# Patient Record
Sex: Male | Born: 1970 | Race: Black or African American | Hispanic: No | Marital: Married
Health system: Midwestern US, Community
[De-identification: ages and names within clinical notes are randomized; demographics above are authoritative.]

## PROBLEM LIST (undated history)

## (undated) DIAGNOSIS — Z1389 Encounter for screening for other disorder: Secondary | ICD-10-CM

## (undated) DIAGNOSIS — M5416 Radiculopathy, lumbar region: Secondary | ICD-10-CM

## (undated) DIAGNOSIS — Z0189 Encounter for other specified special examinations: Secondary | ICD-10-CM

## (undated) DIAGNOSIS — M79605 Pain in left leg: Secondary | ICD-10-CM

## (undated) DIAGNOSIS — R569 Unspecified convulsions: Secondary | ICD-10-CM

## (undated) DIAGNOSIS — Z889 Allergy status to unspecified drugs, medicaments and biological substances status: Secondary | ICD-10-CM

## (undated) DIAGNOSIS — Z87442 Personal history of urinary calculi: Secondary | ICD-10-CM

## (undated) DIAGNOSIS — F4310 Post-traumatic stress disorder, unspecified: Secondary | ICD-10-CM

## (undated) DIAGNOSIS — G4730 Sleep apnea, unspecified: Secondary | ICD-10-CM

## (undated) HISTORY — PX: OTHER SURGICAL HISTORY: SHX169

---

## 2015-05-14 ENCOUNTER — Other Ambulatory Visit: Payer: Self-pay | Admitting: Allergy and Immunology

## 2015-05-14 DIAGNOSIS — J3089 Other allergic rhinitis: Secondary | ICD-10-CM

## 2015-05-15 DIAGNOSIS — J301 Allergic rhinitis due to pollen: Secondary | ICD-10-CM | POA: Diagnosis not present

## 2015-05-16 DIAGNOSIS — J3089 Other allergic rhinitis: Secondary | ICD-10-CM | POA: Diagnosis not present

## 2015-05-19 ENCOUNTER — Ambulatory Visit (INDEPENDENT_AMBULATORY_CARE_PROVIDER_SITE_OTHER): Payer: Federal, State, Local not specified - PPO

## 2015-05-19 DIAGNOSIS — J309 Allergic rhinitis, unspecified: Secondary | ICD-10-CM | POA: Diagnosis not present

## 2015-05-19 NOTE — Progress Notes (Signed)
Immunotherapy   Patient Details  Name: Walter SavinDerrick Ortiz MRN: [id number] Date of Birth: 1970/03/13  05/19/2015  Nancy NordmannDerrick Ortiz---Patient came in today to start ITX. Following schedule: A Frequency:  1-2 times weekly Epi-Pen:  Patient has an EpiPen and understands its use.   Consent signed and patient instructions given. Allergy Guidelines reviewed with patient.   Emergency Action Plan in place and discussed with patient.    Nida Boatmanatricia Lucky Alverson 05/19/2015, 9:46 AM

## 2015-05-22 ENCOUNTER — Ambulatory Visit (INDEPENDENT_AMBULATORY_CARE_PROVIDER_SITE_OTHER): Payer: Federal, State, Local not specified - PPO

## 2015-05-22 DIAGNOSIS — J309 Allergic rhinitis, unspecified: Secondary | ICD-10-CM | POA: Diagnosis not present

## 2015-05-26 ENCOUNTER — Ambulatory Visit (INDEPENDENT_AMBULATORY_CARE_PROVIDER_SITE_OTHER): Payer: Federal, State, Local not specified - PPO

## 2015-05-26 DIAGNOSIS — J309 Allergic rhinitis, unspecified: Secondary | ICD-10-CM | POA: Diagnosis not present

## 2015-05-29 ENCOUNTER — Ambulatory Visit (INDEPENDENT_AMBULATORY_CARE_PROVIDER_SITE_OTHER): Payer: Federal, State, Local not specified - PPO

## 2015-05-29 DIAGNOSIS — J309 Allergic rhinitis, unspecified: Secondary | ICD-10-CM

## 2015-06-02 ENCOUNTER — Ambulatory Visit (INDEPENDENT_AMBULATORY_CARE_PROVIDER_SITE_OTHER): Payer: Federal, State, Local not specified - PPO

## 2015-06-02 DIAGNOSIS — J309 Allergic rhinitis, unspecified: Secondary | ICD-10-CM

## 2015-06-05 ENCOUNTER — Ambulatory Visit (INDEPENDENT_AMBULATORY_CARE_PROVIDER_SITE_OTHER): Payer: Federal, State, Local not specified - PPO

## 2015-06-05 DIAGNOSIS — J309 Allergic rhinitis, unspecified: Secondary | ICD-10-CM

## 2015-06-09 ENCOUNTER — Ambulatory Visit (INDEPENDENT_AMBULATORY_CARE_PROVIDER_SITE_OTHER): Payer: Federal, State, Local not specified - PPO

## 2015-06-09 DIAGNOSIS — J309 Allergic rhinitis, unspecified: Secondary | ICD-10-CM

## 2015-06-16 ENCOUNTER — Ambulatory Visit (INDEPENDENT_AMBULATORY_CARE_PROVIDER_SITE_OTHER): Payer: Federal, State, Local not specified - PPO

## 2015-06-16 DIAGNOSIS — J309 Allergic rhinitis, unspecified: Secondary | ICD-10-CM

## 2015-06-19 ENCOUNTER — Ambulatory Visit (INDEPENDENT_AMBULATORY_CARE_PROVIDER_SITE_OTHER): Payer: Federal, State, Local not specified - PPO

## 2015-06-19 DIAGNOSIS — J309 Allergic rhinitis, unspecified: Secondary | ICD-10-CM

## 2015-06-24 ENCOUNTER — Ambulatory Visit (INDEPENDENT_AMBULATORY_CARE_PROVIDER_SITE_OTHER): Payer: Federal, State, Local not specified - PPO

## 2015-06-24 DIAGNOSIS — J309 Allergic rhinitis, unspecified: Secondary | ICD-10-CM

## 2015-06-26 ENCOUNTER — Ambulatory Visit (INDEPENDENT_AMBULATORY_CARE_PROVIDER_SITE_OTHER): Payer: Federal, State, Local not specified - PPO

## 2015-06-26 DIAGNOSIS — J309 Allergic rhinitis, unspecified: Secondary | ICD-10-CM | POA: Diagnosis not present

## 2015-07-03 ENCOUNTER — Ambulatory Visit (INDEPENDENT_AMBULATORY_CARE_PROVIDER_SITE_OTHER): Payer: Federal, State, Local not specified - PPO

## 2015-07-03 DIAGNOSIS — J309 Allergic rhinitis, unspecified: Secondary | ICD-10-CM | POA: Diagnosis not present

## 2015-07-07 ENCOUNTER — Ambulatory Visit (INDEPENDENT_AMBULATORY_CARE_PROVIDER_SITE_OTHER): Payer: Federal, State, Local not specified - PPO | Admitting: *Deleted

## 2015-07-07 DIAGNOSIS — J309 Allergic rhinitis, unspecified: Secondary | ICD-10-CM | POA: Diagnosis not present

## 2015-07-10 ENCOUNTER — Ambulatory Visit (INDEPENDENT_AMBULATORY_CARE_PROVIDER_SITE_OTHER): Payer: Federal, State, Local not specified - PPO

## 2015-07-10 DIAGNOSIS — J309 Allergic rhinitis, unspecified: Secondary | ICD-10-CM

## 2015-07-14 ENCOUNTER — Ambulatory Visit (INDEPENDENT_AMBULATORY_CARE_PROVIDER_SITE_OTHER): Payer: Federal, State, Local not specified - PPO

## 2015-07-14 DIAGNOSIS — J309 Allergic rhinitis, unspecified: Secondary | ICD-10-CM | POA: Diagnosis not present

## 2015-07-17 ENCOUNTER — Ambulatory Visit (INDEPENDENT_AMBULATORY_CARE_PROVIDER_SITE_OTHER): Payer: Federal, State, Local not specified - PPO

## 2015-07-17 DIAGNOSIS — J309 Allergic rhinitis, unspecified: Secondary | ICD-10-CM | POA: Diagnosis not present

## 2015-07-24 ENCOUNTER — Ambulatory Visit (INDEPENDENT_AMBULATORY_CARE_PROVIDER_SITE_OTHER): Payer: Federal, State, Local not specified - PPO

## 2015-07-24 DIAGNOSIS — J309 Allergic rhinitis, unspecified: Secondary | ICD-10-CM

## 2015-07-28 ENCOUNTER — Ambulatory Visit (INDEPENDENT_AMBULATORY_CARE_PROVIDER_SITE_OTHER): Payer: Federal, State, Local not specified - PPO

## 2015-07-28 DIAGNOSIS — J309 Allergic rhinitis, unspecified: Secondary | ICD-10-CM | POA: Diagnosis not present

## 2015-07-31 ENCOUNTER — Ambulatory Visit (INDEPENDENT_AMBULATORY_CARE_PROVIDER_SITE_OTHER): Payer: Federal, State, Local not specified - PPO

## 2015-07-31 DIAGNOSIS — J309 Allergic rhinitis, unspecified: Secondary | ICD-10-CM

## 2015-08-04 ENCOUNTER — Ambulatory Visit (INDEPENDENT_AMBULATORY_CARE_PROVIDER_SITE_OTHER): Payer: Federal, State, Local not specified - PPO

## 2015-08-04 DIAGNOSIS — J309 Allergic rhinitis, unspecified: Secondary | ICD-10-CM

## 2015-08-07 ENCOUNTER — Ambulatory Visit (INDEPENDENT_AMBULATORY_CARE_PROVIDER_SITE_OTHER): Payer: Federal, State, Local not specified - PPO

## 2015-08-07 DIAGNOSIS — J309 Allergic rhinitis, unspecified: Secondary | ICD-10-CM

## 2015-08-14 ENCOUNTER — Ambulatory Visit (INDEPENDENT_AMBULATORY_CARE_PROVIDER_SITE_OTHER): Payer: Federal, State, Local not specified - PPO

## 2015-08-14 DIAGNOSIS — J309 Allergic rhinitis, unspecified: Secondary | ICD-10-CM | POA: Diagnosis not present

## 2015-08-28 ENCOUNTER — Ambulatory Visit (INDEPENDENT_AMBULATORY_CARE_PROVIDER_SITE_OTHER): Payer: Federal, State, Local not specified - PPO

## 2015-08-28 DIAGNOSIS — J309 Allergic rhinitis, unspecified: Secondary | ICD-10-CM | POA: Diagnosis not present

## 2015-09-01 ENCOUNTER — Ambulatory Visit (INDEPENDENT_AMBULATORY_CARE_PROVIDER_SITE_OTHER): Payer: Federal, State, Local not specified - PPO | Admitting: *Deleted

## 2015-09-01 DIAGNOSIS — J309 Allergic rhinitis, unspecified: Secondary | ICD-10-CM

## 2015-09-04 ENCOUNTER — Ambulatory Visit (INDEPENDENT_AMBULATORY_CARE_PROVIDER_SITE_OTHER): Payer: Federal, State, Local not specified - PPO

## 2015-09-04 DIAGNOSIS — J309 Allergic rhinitis, unspecified: Secondary | ICD-10-CM | POA: Diagnosis not present

## 2015-09-08 ENCOUNTER — Ambulatory Visit (INDEPENDENT_AMBULATORY_CARE_PROVIDER_SITE_OTHER): Payer: Federal, State, Local not specified - PPO | Admitting: *Deleted

## 2015-09-08 DIAGNOSIS — J309 Allergic rhinitis, unspecified: Secondary | ICD-10-CM

## 2015-09-15 ENCOUNTER — Ambulatory Visit (INDEPENDENT_AMBULATORY_CARE_PROVIDER_SITE_OTHER): Payer: Federal, State, Local not specified - PPO | Admitting: *Deleted

## 2015-09-15 DIAGNOSIS — J309 Allergic rhinitis, unspecified: Secondary | ICD-10-CM | POA: Diagnosis not present

## 2015-09-18 ENCOUNTER — Ambulatory Visit (INDEPENDENT_AMBULATORY_CARE_PROVIDER_SITE_OTHER): Payer: Federal, State, Local not specified - PPO

## 2015-09-18 DIAGNOSIS — J309 Allergic rhinitis, unspecified: Secondary | ICD-10-CM | POA: Diagnosis not present

## 2015-09-22 ENCOUNTER — Ambulatory Visit (INDEPENDENT_AMBULATORY_CARE_PROVIDER_SITE_OTHER): Payer: Federal, State, Local not specified - PPO

## 2015-09-22 DIAGNOSIS — J309 Allergic rhinitis, unspecified: Secondary | ICD-10-CM | POA: Diagnosis not present

## 2015-09-25 ENCOUNTER — Telehealth: Payer: Self-pay | Admitting: Pediatrics

## 2015-09-25 ENCOUNTER — Ambulatory Visit (INDEPENDENT_AMBULATORY_CARE_PROVIDER_SITE_OTHER): Payer: Federal, State, Local not specified - PPO

## 2015-09-25 DIAGNOSIS — J309 Allergic rhinitis, unspecified: Secondary | ICD-10-CM | POA: Diagnosis not present

## 2015-09-25 NOTE — Telephone Encounter (Signed)
Pt wife called with a question on a bill. Please call back when possible. Thanks

## 2015-09-25 NOTE — Telephone Encounter (Signed)
Told her we rec'd pmt on 09-01-15

## 2015-09-29 ENCOUNTER — Ambulatory Visit (INDEPENDENT_AMBULATORY_CARE_PROVIDER_SITE_OTHER): Payer: Federal, State, Local not specified - PPO | Admitting: *Deleted

## 2015-09-29 DIAGNOSIS — J309 Allergic rhinitis, unspecified: Secondary | ICD-10-CM | POA: Diagnosis not present

## 2015-10-06 ENCOUNTER — Ambulatory Visit (INDEPENDENT_AMBULATORY_CARE_PROVIDER_SITE_OTHER): Payer: Federal, State, Local not specified - PPO

## 2015-10-06 DIAGNOSIS — J309 Allergic rhinitis, unspecified: Secondary | ICD-10-CM | POA: Diagnosis not present

## 2015-10-13 ENCOUNTER — Ambulatory Visit (INDEPENDENT_AMBULATORY_CARE_PROVIDER_SITE_OTHER): Payer: Federal, State, Local not specified - PPO

## 2015-10-13 DIAGNOSIS — J309 Allergic rhinitis, unspecified: Secondary | ICD-10-CM

## 2015-10-20 ENCOUNTER — Ambulatory Visit (INDEPENDENT_AMBULATORY_CARE_PROVIDER_SITE_OTHER): Payer: Federal, State, Local not specified - PPO

## 2015-10-20 DIAGNOSIS — J309 Allergic rhinitis, unspecified: Secondary | ICD-10-CM

## 2015-11-03 ENCOUNTER — Ambulatory Visit (INDEPENDENT_AMBULATORY_CARE_PROVIDER_SITE_OTHER): Payer: Federal, State, Local not specified - PPO | Admitting: *Deleted

## 2015-11-03 DIAGNOSIS — J309 Allergic rhinitis, unspecified: Secondary | ICD-10-CM

## 2015-11-10 ENCOUNTER — Ambulatory Visit (INDEPENDENT_AMBULATORY_CARE_PROVIDER_SITE_OTHER): Payer: Federal, State, Local not specified - PPO

## 2015-11-10 DIAGNOSIS — J309 Allergic rhinitis, unspecified: Secondary | ICD-10-CM | POA: Diagnosis not present

## 2015-11-17 ENCOUNTER — Ambulatory Visit (INDEPENDENT_AMBULATORY_CARE_PROVIDER_SITE_OTHER): Payer: Federal, State, Local not specified - PPO | Admitting: *Deleted

## 2015-11-17 DIAGNOSIS — J309 Allergic rhinitis, unspecified: Secondary | ICD-10-CM | POA: Diagnosis not present

## 2015-11-24 ENCOUNTER — Ambulatory Visit (INDEPENDENT_AMBULATORY_CARE_PROVIDER_SITE_OTHER): Payer: Federal, State, Local not specified - PPO

## 2015-11-24 DIAGNOSIS — J309 Allergic rhinitis, unspecified: Secondary | ICD-10-CM | POA: Diagnosis not present

## 2015-12-02 ENCOUNTER — Ambulatory Visit (INDEPENDENT_AMBULATORY_CARE_PROVIDER_SITE_OTHER): Payer: Federal, State, Local not specified - PPO | Admitting: *Deleted

## 2015-12-02 DIAGNOSIS — J309 Allergic rhinitis, unspecified: Secondary | ICD-10-CM

## 2015-12-08 ENCOUNTER — Ambulatory Visit (INDEPENDENT_AMBULATORY_CARE_PROVIDER_SITE_OTHER): Payer: Federal, State, Local not specified - PPO | Admitting: *Deleted

## 2015-12-08 DIAGNOSIS — J309 Allergic rhinitis, unspecified: Secondary | ICD-10-CM | POA: Diagnosis not present

## 2015-12-15 ENCOUNTER — Ambulatory Visit (INDEPENDENT_AMBULATORY_CARE_PROVIDER_SITE_OTHER): Payer: Federal, State, Local not specified - PPO | Admitting: *Deleted

## 2015-12-15 DIAGNOSIS — F4323 Adjustment disorder with mixed anxiety and depressed mood: Secondary | ICD-10-CM | POA: Diagnosis not present

## 2015-12-15 DIAGNOSIS — J309 Allergic rhinitis, unspecified: Secondary | ICD-10-CM

## 2015-12-16 DIAGNOSIS — J301 Allergic rhinitis due to pollen: Secondary | ICD-10-CM | POA: Diagnosis not present

## 2015-12-17 DIAGNOSIS — J3089 Other allergic rhinitis: Secondary | ICD-10-CM | POA: Diagnosis not present

## 2015-12-22 ENCOUNTER — Ambulatory Visit (INDEPENDENT_AMBULATORY_CARE_PROVIDER_SITE_OTHER): Payer: Federal, State, Local not specified - PPO

## 2015-12-22 DIAGNOSIS — J309 Allergic rhinitis, unspecified: Secondary | ICD-10-CM

## 2015-12-29 ENCOUNTER — Ambulatory Visit (INDEPENDENT_AMBULATORY_CARE_PROVIDER_SITE_OTHER): Payer: Federal, State, Local not specified - PPO | Admitting: *Deleted

## 2015-12-29 DIAGNOSIS — J309 Allergic rhinitis, unspecified: Secondary | ICD-10-CM

## 2015-12-29 DIAGNOSIS — F4323 Adjustment disorder with mixed anxiety and depressed mood: Secondary | ICD-10-CM | POA: Diagnosis not present

## 2016-01-05 ENCOUNTER — Ambulatory Visit (INDEPENDENT_AMBULATORY_CARE_PROVIDER_SITE_OTHER): Payer: Federal, State, Local not specified - PPO | Admitting: *Deleted

## 2016-01-05 DIAGNOSIS — J309 Allergic rhinitis, unspecified: Secondary | ICD-10-CM | POA: Diagnosis not present

## 2016-01-05 DIAGNOSIS — F4323 Adjustment disorder with mixed anxiety and depressed mood: Secondary | ICD-10-CM | POA: Diagnosis not present

## 2016-01-12 ENCOUNTER — Ambulatory Visit (INDEPENDENT_AMBULATORY_CARE_PROVIDER_SITE_OTHER): Payer: Federal, State, Local not specified - PPO | Admitting: *Deleted

## 2016-01-12 DIAGNOSIS — J309 Allergic rhinitis, unspecified: Secondary | ICD-10-CM | POA: Diagnosis not present

## 2016-01-12 DIAGNOSIS — F4323 Adjustment disorder with mixed anxiety and depressed mood: Secondary | ICD-10-CM | POA: Diagnosis not present

## 2016-01-22 ENCOUNTER — Ambulatory Visit (INDEPENDENT_AMBULATORY_CARE_PROVIDER_SITE_OTHER): Payer: Federal, State, Local not specified - PPO | Admitting: *Deleted

## 2016-01-22 DIAGNOSIS — J309 Allergic rhinitis, unspecified: Secondary | ICD-10-CM | POA: Diagnosis not present

## 2016-01-26 ENCOUNTER — Ambulatory Visit (INDEPENDENT_AMBULATORY_CARE_PROVIDER_SITE_OTHER): Payer: Federal, State, Local not specified - PPO | Admitting: *Deleted

## 2016-01-26 DIAGNOSIS — J309 Allergic rhinitis, unspecified: Secondary | ICD-10-CM | POA: Diagnosis not present

## 2016-02-02 ENCOUNTER — Ambulatory Visit (INDEPENDENT_AMBULATORY_CARE_PROVIDER_SITE_OTHER): Payer: Federal, State, Local not specified - PPO | Admitting: *Deleted

## 2016-02-02 DIAGNOSIS — J309 Allergic rhinitis, unspecified: Secondary | ICD-10-CM

## 2016-02-09 DIAGNOSIS — F4323 Adjustment disorder with mixed anxiety and depressed mood: Secondary | ICD-10-CM | POA: Diagnosis not present

## 2016-02-13 DIAGNOSIS — G4733 Obstructive sleep apnea (adult) (pediatric): Secondary | ICD-10-CM | POA: Diagnosis not present

## 2016-02-26 ENCOUNTER — Ambulatory Visit (INDEPENDENT_AMBULATORY_CARE_PROVIDER_SITE_OTHER): Payer: Federal, State, Local not specified - PPO

## 2016-02-26 DIAGNOSIS — J309 Allergic rhinitis, unspecified: Secondary | ICD-10-CM

## 2016-03-04 ENCOUNTER — Ambulatory Visit (INDEPENDENT_AMBULATORY_CARE_PROVIDER_SITE_OTHER): Payer: Federal, State, Local not specified - PPO

## 2016-03-04 DIAGNOSIS — J309 Allergic rhinitis, unspecified: Secondary | ICD-10-CM | POA: Diagnosis not present

## 2016-03-15 DIAGNOSIS — F4323 Adjustment disorder with mixed anxiety and depressed mood: Secondary | ICD-10-CM | POA: Diagnosis not present

## 2016-03-16 ENCOUNTER — Ambulatory Visit (INDEPENDENT_AMBULATORY_CARE_PROVIDER_SITE_OTHER): Payer: Federal, State, Local not specified - PPO | Admitting: *Deleted

## 2016-03-16 DIAGNOSIS — J309 Allergic rhinitis, unspecified: Secondary | ICD-10-CM

## 2016-03-25 ENCOUNTER — Ambulatory Visit (INDEPENDENT_AMBULATORY_CARE_PROVIDER_SITE_OTHER): Payer: Federal, State, Local not specified - PPO

## 2016-03-25 DIAGNOSIS — J309 Allergic rhinitis, unspecified: Secondary | ICD-10-CM | POA: Diagnosis not present

## 2016-04-01 ENCOUNTER — Ambulatory Visit (INDEPENDENT_AMBULATORY_CARE_PROVIDER_SITE_OTHER): Payer: Federal, State, Local not specified - PPO | Admitting: *Deleted

## 2016-04-01 DIAGNOSIS — J309 Allergic rhinitis, unspecified: Secondary | ICD-10-CM

## 2016-04-08 ENCOUNTER — Ambulatory Visit (INDEPENDENT_AMBULATORY_CARE_PROVIDER_SITE_OTHER): Payer: Federal, State, Local not specified - PPO

## 2016-04-08 DIAGNOSIS — J309 Allergic rhinitis, unspecified: Secondary | ICD-10-CM

## 2016-04-22 ENCOUNTER — Ambulatory Visit (INDEPENDENT_AMBULATORY_CARE_PROVIDER_SITE_OTHER): Payer: Federal, State, Local not specified - PPO | Admitting: *Deleted

## 2016-04-22 DIAGNOSIS — J309 Allergic rhinitis, unspecified: Secondary | ICD-10-CM | POA: Diagnosis not present

## 2016-04-29 DIAGNOSIS — J301 Allergic rhinitis due to pollen: Secondary | ICD-10-CM | POA: Diagnosis not present

## 2016-04-30 DIAGNOSIS — J3089 Other allergic rhinitis: Secondary | ICD-10-CM | POA: Diagnosis not present

## 2016-05-06 ENCOUNTER — Ambulatory Visit (INDEPENDENT_AMBULATORY_CARE_PROVIDER_SITE_OTHER): Payer: Federal, State, Local not specified - PPO | Admitting: *Deleted

## 2016-05-06 DIAGNOSIS — J309 Allergic rhinitis, unspecified: Secondary | ICD-10-CM

## 2016-05-10 DIAGNOSIS — F4323 Adjustment disorder with mixed anxiety and depressed mood: Secondary | ICD-10-CM | POA: Diagnosis not present

## 2016-05-13 ENCOUNTER — Ambulatory Visit (INDEPENDENT_AMBULATORY_CARE_PROVIDER_SITE_OTHER): Payer: Federal, State, Local not specified - PPO

## 2016-05-13 DIAGNOSIS — J309 Allergic rhinitis, unspecified: Secondary | ICD-10-CM | POA: Diagnosis not present

## 2016-05-17 ENCOUNTER — Emergency Department (HOSPITAL_COMMUNITY)
Admission: EM | Admit: 2016-05-17 | Discharge: 2016-05-17 | Disposition: A | Discharge status: Home / Self Care | Payer: Federal, State, Local not specified - PPO | Attending: Emergency Medicine | Admitting: Emergency Medicine

## 2016-05-17 ENCOUNTER — Encounter (HOSPITAL_COMMUNITY): Payer: Self-pay | Admitting: Emergency Medicine

## 2016-05-17 DIAGNOSIS — R569 Unspecified convulsions: Secondary | ICD-10-CM

## 2016-05-17 DIAGNOSIS — Z87891 Personal history of nicotine dependence: Secondary | ICD-10-CM | POA: Diagnosis not present

## 2016-05-17 DIAGNOSIS — R9431 Abnormal electrocardiogram [ECG] [EKG]: Secondary | ICD-10-CM | POA: Diagnosis not present

## 2016-05-17 HISTORY — DX: Sleep apnea, unspecified: G47.30

## 2016-05-17 HISTORY — DX: Allergy status to unspecified drugs, medicaments and biological substances: Z88.9

## 2016-05-17 HISTORY — DX: Unspecified convulsions: R56.9

## 2016-05-17 HISTORY — DX: Post-traumatic stress disorder, unspecified: F43.10

## 2016-05-17 MED ORDER — LORAZEPAM 2 MG/ML IJ SOLN
1.0000 mg | Freq: Once | INTRAMUSCULAR | Status: AC
  Administered 2016-05-17: 1 mg via INTRAVENOUS
  Filled 2016-05-17: qty 1

## 2016-05-17 NOTE — ED Notes (Signed)
Bed: RESB Expected date:  Expected time:  Means of arrival:  Comments: 7340m seizure

## 2016-05-17 NOTE — ED Provider Notes (Signed)
[facility]-EMERGENCY DEPT Provider Note: Walter DellJ. Lane Kyrillos Adams, MD, FACEP  CSN: [id number] MRN: [id number] ARRIVAL: 05/17/16 at 0045 ROOM: RESB/RESB  By signing my name below, I, [name], attest that this documentation has been prepared under the direction and in the presence of Paula LibraJohn Panda Crossin, MD. Electronically signed, [name], ED Scribe. 05/17/16. 1:36 AM.   CHIEF COMPLAINT  Seizures   HISTORY OF PRESENT ILLNESS  HPI Comments: [name] is a 46 y.o. male BIB EMS with Hx of seizures and PTSD who presents to the Emergency Department s/p 2 seizure like episodes ~2330 yesterday evening. Family notes two seizure-like episodes lasting <1 minute each and occurring about 15 minutes apart. EMS reports pt was postictal on arrival with incoherent speech that gradually improved; at about 0026 this morning he was noted to be A/O x 4. Pt reportedly does not recall the events. Family notes associated urinary incontinence. Patient denies injury but complains of a mild headache. This is his first seizure in about 5 years. His seizures are usually triggered by stress and sleep deprivation and he admits to both recently. He has never been on seizure medication as an outpatient. He denies illicit drug use.  R eye redness noted, which reportedly occurs often after allergy shots which he gets weekly.    Past Medical History:  Diagnosis Date  . Multiple allergies   . PTSD (post-traumatic stress disorder)   . Seizures (HCC)   . Sleep apnea     History reviewed. No pertinent surgical history.  History reviewed. No pertinent family history.  Social History  Substance Use Topics  . Smoking status: Former Smoker    Types: E-cigarettes  . Smokeless tobacco: Never Used  . Alcohol use Not on file    Prior to Admission medications   Not on File    Allergies Dilantin [phenytoin]   REVIEW OF SYSTEMS  Negative except as noted here or in the History of Present Illness.   PHYSICAL EXAMINATION    Initial Vital Signs Blood pressure (!) 125/92, pulse 77, temperature 97.6 F (36.4 C), temperature source Oral, resp. rate 19, SpO2 97 %.  Examination General: Well-developed, well-nourished male in no acute distress; appearance consistent with age of record HENT: normocephalic; atraumatic Eyes: pupils equal, round and reactive to light; extraocular muscles intact; mild R conjunctival injection Neck: supple Heart: regular rate and rhythm; no murmurs, rubs or gallops Lungs: clear to auscultation bilaterally Abdomen: soft; nondistended; nontender; no masses or hepatosplenomegaly; bowel sounds present Extremities: No deformity; full range of motion; pulses normal Neurologic: Awake, alert and oriented; motor function intact in all; NL finger to nose test; negative Romberg's test, extremities and symmetric; no facial droop Skin: Warm and dry Psychiatric: Normal mood and affect   RESULTS  Summary of this visit's results, reviewed by myself:   EKG Interpretation  Date/Time:  Monday May 17 2016 00:54:29 EDT Ventricular Rate:  82 PR Interval:    QRS Duration: 85 QT Interval:  359 QTC Calculation: 420 R Axis:   2 Text Interpretation:  Sinus rhythm Borderline T abnormalities, inferior leads ST elev, probable normal early repol pattern No previous ECGs available Confirmed by Chanel Mckesson  MD, Jonny RuizJOHN ([id number]) on 05/17/2016 2:14:19 AM      Laboratory Studies: No results found for this or any previous visit (from the past 24 hour(s)). Imaging Studies: No results found.  ED COURSE  Nursing notes and initial vitals signs, including pulse oximetry, reviewed.  Vitals:   05/17/16 0050 05/17/16 0100  BP:  Marland Kitchen(!)  125/92  Pulse:  77  Resp:  19  Temp:  97.6 F (36.4 C)  TempSrc:  Oral  SpO2: 100% 97%   2:21 AM Patient awake and alert. He has had no seizure activity in the ED. He was given Ativan one milligram IV. Pt told he cannot drive for 6 months unless otherwise cleared by a physician. He  does not currently have a neurologist and we will refer him.   PROCEDURES    ED DIAGNOSES     ICD-9-CM ICD-10-CM   1. Seizures (HCC) 780.39 R56.9      I personally performed the services described in this documentation, which was scribed in my presence. The recorded information has been reviewed and is accurate.    Paula Libra, MD 05/17/16 [phone]

## 2016-05-17 NOTE — ED Triage Notes (Signed)
Patient BIB EMS for 2 episodes of seizure like activity. Family reported patients eyes rolled back in his head and he was incontinent of urine. Patient has HX of sleep depression and stress induced seizures as well as Hx of PTSD. EMS reported pt was postictal on arrival, confused with random speech. Patient gradually became conscious and alert at 0026. Patient a/o x3, does not recall event. Pt reports not sleeping well, stress, headache and leg stiffness.

## 2016-05-19 ENCOUNTER — Ambulatory Visit (INDEPENDENT_AMBULATORY_CARE_PROVIDER_SITE_OTHER): Payer: Federal, State, Local not specified - PPO

## 2016-05-19 DIAGNOSIS — J309 Allergic rhinitis, unspecified: Secondary | ICD-10-CM | POA: Diagnosis not present

## 2016-05-24 DIAGNOSIS — F4323 Adjustment disorder with mixed anxiety and depressed mood: Secondary | ICD-10-CM | POA: Diagnosis not present

## 2016-06-03 ENCOUNTER — Ambulatory Visit (INDEPENDENT_AMBULATORY_CARE_PROVIDER_SITE_OTHER): Payer: Federal, State, Local not specified - PPO | Admitting: *Deleted

## 2016-06-03 DIAGNOSIS — J309 Allergic rhinitis, unspecified: Secondary | ICD-10-CM

## 2016-06-10 ENCOUNTER — Ambulatory Visit (INDEPENDENT_AMBULATORY_CARE_PROVIDER_SITE_OTHER): Payer: Federal, State, Local not specified - PPO

## 2016-06-10 DIAGNOSIS — J309 Allergic rhinitis, unspecified: Secondary | ICD-10-CM | POA: Diagnosis not present

## 2016-06-14 DIAGNOSIS — F4323 Adjustment disorder with mixed anxiety and depressed mood: Secondary | ICD-10-CM | POA: Diagnosis not present

## 2016-06-17 ENCOUNTER — Ambulatory Visit (INDEPENDENT_AMBULATORY_CARE_PROVIDER_SITE_OTHER): Payer: Federal, State, Local not specified - PPO | Admitting: *Deleted

## 2016-06-17 DIAGNOSIS — J309 Allergic rhinitis, unspecified: Secondary | ICD-10-CM | POA: Diagnosis not present

## 2016-07-01 ENCOUNTER — Ambulatory Visit (INDEPENDENT_AMBULATORY_CARE_PROVIDER_SITE_OTHER): Payer: Federal, State, Local not specified - PPO | Admitting: *Deleted

## 2016-07-01 DIAGNOSIS — J309 Allergic rhinitis, unspecified: Secondary | ICD-10-CM | POA: Diagnosis not present

## 2016-07-15 ENCOUNTER — Ambulatory Visit (INDEPENDENT_AMBULATORY_CARE_PROVIDER_SITE_OTHER): Payer: Federal, State, Local not specified - PPO | Admitting: *Deleted

## 2016-07-15 DIAGNOSIS — J309 Allergic rhinitis, unspecified: Secondary | ICD-10-CM | POA: Diagnosis not present

## 2016-07-15 DIAGNOSIS — G4733 Obstructive sleep apnea (adult) (pediatric): Secondary | ICD-10-CM | POA: Diagnosis not present

## 2016-07-19 DIAGNOSIS — F4323 Adjustment disorder with mixed anxiety and depressed mood: Secondary | ICD-10-CM | POA: Diagnosis not present

## 2016-07-29 ENCOUNTER — Ambulatory Visit (INDEPENDENT_AMBULATORY_CARE_PROVIDER_SITE_OTHER): Payer: Federal, State, Local not specified - PPO | Admitting: *Deleted

## 2016-07-29 DIAGNOSIS — J309 Allergic rhinitis, unspecified: Secondary | ICD-10-CM

## 2016-08-02 DIAGNOSIS — F4323 Adjustment disorder with mixed anxiety and depressed mood: Secondary | ICD-10-CM | POA: Diagnosis not present

## 2016-08-10 NOTE — Progress Notes (Signed)
VIALS EXP 08-13-17  HC/JM 

## 2016-08-12 ENCOUNTER — Ambulatory Visit (INDEPENDENT_AMBULATORY_CARE_PROVIDER_SITE_OTHER): Payer: Federal, State, Local not specified - PPO | Admitting: *Deleted

## 2016-08-12 DIAGNOSIS — J309 Allergic rhinitis, unspecified: Secondary | ICD-10-CM

## 2016-08-13 DIAGNOSIS — J301 Allergic rhinitis due to pollen: Secondary | ICD-10-CM | POA: Diagnosis not present

## 2016-09-02 ENCOUNTER — Ambulatory Visit (INDEPENDENT_AMBULATORY_CARE_PROVIDER_SITE_OTHER): Payer: Federal, State, Local not specified - PPO | Admitting: *Deleted

## 2016-09-02 DIAGNOSIS — J309 Allergic rhinitis, unspecified: Secondary | ICD-10-CM | POA: Diagnosis not present

## 2016-09-06 DIAGNOSIS — F4323 Adjustment disorder with mixed anxiety and depressed mood: Secondary | ICD-10-CM | POA: Diagnosis not present

## 2016-09-16 ENCOUNTER — Ambulatory Visit (INDEPENDENT_AMBULATORY_CARE_PROVIDER_SITE_OTHER): Payer: Federal, State, Local not specified - PPO | Admitting: *Deleted

## 2016-09-16 DIAGNOSIS — J309 Allergic rhinitis, unspecified: Secondary | ICD-10-CM | POA: Diagnosis not present

## 2016-10-01 ENCOUNTER — Ambulatory Visit (INDEPENDENT_AMBULATORY_CARE_PROVIDER_SITE_OTHER): Payer: Federal, State, Local not specified - PPO

## 2016-10-01 DIAGNOSIS — J309 Allergic rhinitis, unspecified: Secondary | ICD-10-CM | POA: Diagnosis not present

## 2016-10-11 DIAGNOSIS — F4323 Adjustment disorder with mixed anxiety and depressed mood: Secondary | ICD-10-CM | POA: Diagnosis not present

## 2016-10-14 ENCOUNTER — Ambulatory Visit (INDEPENDENT_AMBULATORY_CARE_PROVIDER_SITE_OTHER): Payer: Federal, State, Local not specified - PPO | Admitting: *Deleted

## 2016-10-14 DIAGNOSIS — J309 Allergic rhinitis, unspecified: Secondary | ICD-10-CM | POA: Diagnosis not present

## 2016-10-20 DIAGNOSIS — G4733 Obstructive sleep apnea (adult) (pediatric): Secondary | ICD-10-CM | POA: Diagnosis not present

## 2016-10-21 ENCOUNTER — Ambulatory Visit (INDEPENDENT_AMBULATORY_CARE_PROVIDER_SITE_OTHER): Payer: Federal, State, Local not specified - PPO | Admitting: *Deleted

## 2016-10-21 DIAGNOSIS — J309 Allergic rhinitis, unspecified: Secondary | ICD-10-CM

## 2016-10-28 ENCOUNTER — Ambulatory Visit (INDEPENDENT_AMBULATORY_CARE_PROVIDER_SITE_OTHER): Payer: Federal, State, Local not specified - PPO | Admitting: *Deleted

## 2016-10-28 DIAGNOSIS — J309 Allergic rhinitis, unspecified: Secondary | ICD-10-CM

## 2016-11-04 ENCOUNTER — Ambulatory Visit (INDEPENDENT_AMBULATORY_CARE_PROVIDER_SITE_OTHER): Payer: Federal, State, Local not specified - PPO | Admitting: *Deleted

## 2016-11-04 DIAGNOSIS — J309 Allergic rhinitis, unspecified: Secondary | ICD-10-CM

## 2016-11-08 DIAGNOSIS — F4323 Adjustment disorder with mixed anxiety and depressed mood: Secondary | ICD-10-CM | POA: Diagnosis not present

## 2016-11-11 DIAGNOSIS — M5136 Other intervertebral disc degeneration, lumbar region: Secondary | ICD-10-CM | POA: Diagnosis not present

## 2016-11-11 DIAGNOSIS — M5416 Radiculopathy, lumbar region: Secondary | ICD-10-CM | POA: Diagnosis not present

## 2016-11-11 DIAGNOSIS — M545 Low back pain: Secondary | ICD-10-CM | POA: Diagnosis not present

## 2016-11-15 ENCOUNTER — Other Ambulatory Visit: Payer: Self-pay | Admitting: Orthopaedic Surgery

## 2016-11-15 DIAGNOSIS — M5136 Other intervertebral disc degeneration, lumbar region: Secondary | ICD-10-CM

## 2016-11-18 ENCOUNTER — Ambulatory Visit (INDEPENDENT_AMBULATORY_CARE_PROVIDER_SITE_OTHER): Payer: Federal, State, Local not specified - PPO

## 2016-11-18 DIAGNOSIS — J309 Allergic rhinitis, unspecified: Secondary | ICD-10-CM

## 2016-11-26 DIAGNOSIS — M5442 Lumbago with sciatica, left side: Secondary | ICD-10-CM | POA: Diagnosis not present

## 2016-11-26 DIAGNOSIS — M545 Low back pain: Secondary | ICD-10-CM | POA: Diagnosis not present

## 2016-11-30 DIAGNOSIS — M545 Low back pain: Secondary | ICD-10-CM | POA: Diagnosis not present

## 2016-11-30 DIAGNOSIS — M5442 Lumbago with sciatica, left side: Secondary | ICD-10-CM | POA: Diagnosis not present

## 2016-12-03 ENCOUNTER — Ambulatory Visit
Admission: RE | Admit: 2016-12-03 | Discharge: 2016-12-03 | Disposition: A | Discharge status: Home / Self Care | Payer: Federal, State, Local not specified - PPO | Source: Ambulatory Visit | Attending: Orthopaedic Surgery | Admitting: Orthopaedic Surgery

## 2016-12-03 DIAGNOSIS — M5136 Other intervertebral disc degeneration, lumbar region: Secondary | ICD-10-CM

## 2016-12-03 DIAGNOSIS — M48061 Spinal stenosis, lumbar region without neurogenic claudication: Secondary | ICD-10-CM | POA: Diagnosis not present

## 2016-12-06 DIAGNOSIS — F4323 Adjustment disorder with mixed anxiety and depressed mood: Secondary | ICD-10-CM | POA: Diagnosis not present

## 2016-12-07 DIAGNOSIS — M5442 Lumbago with sciatica, left side: Secondary | ICD-10-CM | POA: Diagnosis not present

## 2016-12-07 DIAGNOSIS — M5416 Radiculopathy, lumbar region: Secondary | ICD-10-CM | POA: Diagnosis not present

## 2016-12-07 DIAGNOSIS — M5136 Other intervertebral disc degeneration, lumbar region: Secondary | ICD-10-CM | POA: Diagnosis not present

## 2016-12-07 DIAGNOSIS — M545 Low back pain: Secondary | ICD-10-CM | POA: Diagnosis not present

## 2016-12-09 DIAGNOSIS — M5416 Radiculopathy, lumbar region: Secondary | ICD-10-CM | POA: Diagnosis not present

## 2016-12-09 DIAGNOSIS — M5442 Lumbago with sciatica, left side: Secondary | ICD-10-CM | POA: Diagnosis not present

## 2016-12-09 DIAGNOSIS — M5136 Other intervertebral disc degeneration, lumbar region: Secondary | ICD-10-CM | POA: Diagnosis not present

## 2016-12-09 DIAGNOSIS — M545 Low back pain: Secondary | ICD-10-CM | POA: Diagnosis not present

## 2016-12-14 DIAGNOSIS — M25552 Pain in left hip: Secondary | ICD-10-CM | POA: Diagnosis not present

## 2016-12-14 DIAGNOSIS — G4733 Obstructive sleep apnea (adult) (pediatric): Secondary | ICD-10-CM | POA: Diagnosis not present

## 2016-12-14 DIAGNOSIS — Z0000 Encounter for general adult medical examination without abnormal findings: Secondary | ICD-10-CM | POA: Diagnosis not present

## 2016-12-14 DIAGNOSIS — R739 Hyperglycemia, unspecified: Secondary | ICD-10-CM | POA: Diagnosis not present

## 2016-12-14 DIAGNOSIS — M4805 Spinal stenosis, thoracolumbar region: Secondary | ICD-10-CM | POA: Diagnosis not present

## 2016-12-16 DIAGNOSIS — M5442 Lumbago with sciatica, left side: Secondary | ICD-10-CM | POA: Diagnosis not present

## 2016-12-16 DIAGNOSIS — M545 Low back pain: Secondary | ICD-10-CM | POA: Diagnosis not present

## 2016-12-20 DIAGNOSIS — F4323 Adjustment disorder with mixed anxiety and depressed mood: Secondary | ICD-10-CM | POA: Diagnosis not present

## 2016-12-21 DIAGNOSIS — M5416 Radiculopathy, lumbar region: Secondary | ICD-10-CM | POA: Diagnosis not present

## 2016-12-31 DIAGNOSIS — K080 Exfoliation of teeth due to systemic causes: Secondary | ICD-10-CM | POA: Diagnosis not present

## 2017-01-04 DIAGNOSIS — M5416 Radiculopathy, lumbar region: Secondary | ICD-10-CM | POA: Diagnosis not present

## 2017-01-06 DIAGNOSIS — F4323 Adjustment disorder with mixed anxiety and depressed mood: Secondary | ICD-10-CM | POA: Diagnosis not present

## 2017-01-18 DIAGNOSIS — M5136 Other intervertebral disc degeneration, lumbar region: Secondary | ICD-10-CM | POA: Diagnosis not present

## 2017-01-18 DIAGNOSIS — M48061 Spinal stenosis, lumbar region without neurogenic claudication: Secondary | ICD-10-CM | POA: Diagnosis not present

## 2017-01-18 DIAGNOSIS — M5416 Radiculopathy, lumbar region: Secondary | ICD-10-CM | POA: Diagnosis not present

## 2017-02-07 DIAGNOSIS — F4323 Adjustment disorder with mixed anxiety and depressed mood: Secondary | ICD-10-CM | POA: Diagnosis not present

## 2017-02-24 NOTE — Progress Notes (Signed)
VIALS EXP 02-25-18 

## 2017-02-25 NOTE — Progress Notes (Signed)
Did not make vials. Last injection in September.

## 2017-03-07 DIAGNOSIS — F4323 Adjustment disorder with mixed anxiety and depressed mood: Secondary | ICD-10-CM | POA: Diagnosis not present

## 2017-03-14 DIAGNOSIS — F4323 Adjustment disorder with mixed anxiety and depressed mood: Secondary | ICD-10-CM | POA: Diagnosis not present

## 2017-03-16 DIAGNOSIS — M5416 Radiculopathy, lumbar region: Secondary | ICD-10-CM | POA: Diagnosis not present

## 2017-03-23 DIAGNOSIS — G4733 Obstructive sleep apnea (adult) (pediatric): Secondary | ICD-10-CM | POA: Diagnosis not present

## 2017-03-28 DIAGNOSIS — F4323 Adjustment disorder with mixed anxiety and depressed mood: Secondary | ICD-10-CM | POA: Diagnosis not present

## 2017-04-11 DIAGNOSIS — F4323 Adjustment disorder with mixed anxiety and depressed mood: Secondary | ICD-10-CM | POA: Diagnosis not present

## 2017-04-18 DIAGNOSIS — F4323 Adjustment disorder with mixed anxiety and depressed mood: Secondary | ICD-10-CM | POA: Diagnosis not present

## 2017-04-21 DIAGNOSIS — M5136 Other intervertebral disc degeneration, lumbar region: Secondary | ICD-10-CM | POA: Diagnosis not present

## 2017-04-21 DIAGNOSIS — M5416 Radiculopathy, lumbar region: Secondary | ICD-10-CM | POA: Diagnosis not present

## 2017-05-02 DIAGNOSIS — F4323 Adjustment disorder with mixed anxiety and depressed mood: Secondary | ICD-10-CM | POA: Diagnosis not present

## 2017-05-16 DIAGNOSIS — F4323 Adjustment disorder with mixed anxiety and depressed mood: Secondary | ICD-10-CM | POA: Diagnosis not present

## 2017-05-23 DIAGNOSIS — F4323 Adjustment disorder with mixed anxiety and depressed mood: Secondary | ICD-10-CM | POA: Diagnosis not present

## 2017-06-01 DIAGNOSIS — F419 Anxiety disorder, unspecified: Secondary | ICD-10-CM | POA: Diagnosis not present

## 2017-06-01 DIAGNOSIS — Z630 Problems in relationship with spouse or partner: Secondary | ICD-10-CM | POA: Diagnosis not present

## 2017-06-20 DIAGNOSIS — F4323 Adjustment disorder with mixed anxiety and depressed mood: Secondary | ICD-10-CM | POA: Diagnosis not present

## 2017-06-21 DIAGNOSIS — G4733 Obstructive sleep apnea (adult) (pediatric): Secondary | ICD-10-CM | POA: Diagnosis not present

## 2017-06-25 DIAGNOSIS — N390 Urinary tract infection, site not specified: Secondary | ICD-10-CM | POA: Diagnosis not present

## 2017-07-04 DIAGNOSIS — F4323 Adjustment disorder with mixed anxiety and depressed mood: Secondary | ICD-10-CM | POA: Diagnosis not present

## 2017-07-25 DIAGNOSIS — Z630 Problems in relationship with spouse or partner: Secondary | ICD-10-CM | POA: Diagnosis not present

## 2017-07-25 DIAGNOSIS — F419 Anxiety disorder, unspecified: Secondary | ICD-10-CM | POA: Diagnosis not present

## 2017-07-26 DIAGNOSIS — F4323 Adjustment disorder with mixed anxiety and depressed mood: Secondary | ICD-10-CM | POA: Diagnosis not present

## 2017-08-02 DIAGNOSIS — F4323 Adjustment disorder with mixed anxiety and depressed mood: Secondary | ICD-10-CM | POA: Diagnosis not present

## 2017-08-16 DIAGNOSIS — F4323 Adjustment disorder with mixed anxiety and depressed mood: Secondary | ICD-10-CM | POA: Diagnosis not present

## 2017-09-13 DIAGNOSIS — F4323 Adjustment disorder with mixed anxiety and depressed mood: Secondary | ICD-10-CM | POA: Diagnosis not present

## 2017-09-20 DIAGNOSIS — F4323 Adjustment disorder with mixed anxiety and depressed mood: Secondary | ICD-10-CM | POA: Diagnosis not present

## 2017-09-27 DIAGNOSIS — F4323 Adjustment disorder with mixed anxiety and depressed mood: Secondary | ICD-10-CM | POA: Diagnosis not present

## 2017-10-04 DIAGNOSIS — F4323 Adjustment disorder with mixed anxiety and depressed mood: Secondary | ICD-10-CM | POA: Diagnosis not present

## 2017-10-18 DIAGNOSIS — F4323 Adjustment disorder with mixed anxiety and depressed mood: Secondary | ICD-10-CM | POA: Diagnosis not present

## 2017-11-01 DIAGNOSIS — F4323 Adjustment disorder with mixed anxiety and depressed mood: Secondary | ICD-10-CM | POA: Diagnosis not present

## 2017-11-03 DIAGNOSIS — G4733 Obstructive sleep apnea (adult) (pediatric): Secondary | ICD-10-CM | POA: Diagnosis not present

## 2017-11-07 ENCOUNTER — Other Ambulatory Visit (HOSPITAL_BASED_OUTPATIENT_CLINIC_OR_DEPARTMENT_OTHER): Payer: Self-pay

## 2017-11-07 DIAGNOSIS — R0683 Snoring: Secondary | ICD-10-CM

## 2017-11-07 DIAGNOSIS — G4730 Sleep apnea, unspecified: Secondary | ICD-10-CM

## 2017-11-21 DIAGNOSIS — K080 Exfoliation of teeth due to systemic causes: Secondary | ICD-10-CM | POA: Diagnosis not present

## 2017-11-22 ENCOUNTER — Ambulatory Visit (HOSPITAL_BASED_OUTPATIENT_CLINIC_OR_DEPARTMENT_OTHER): Payer: Federal, State, Local not specified - PPO | Attending: Otolaryngology | Admitting: Internal Medicine

## 2017-11-22 VITALS — Ht 73.0 in | Wt 240.0 lb

## 2017-11-22 DIAGNOSIS — G4730 Sleep apnea, unspecified: Secondary | ICD-10-CM | POA: Diagnosis not present

## 2017-11-22 DIAGNOSIS — R0902 Hypoxemia: Secondary | ICD-10-CM | POA: Diagnosis not present

## 2017-11-22 DIAGNOSIS — G4733 Obstructive sleep apnea (adult) (pediatric): Secondary | ICD-10-CM | POA: Insufficient documentation

## 2017-11-22 DIAGNOSIS — R0683 Snoring: Secondary | ICD-10-CM

## 2017-11-26 DIAGNOSIS — G4733 Obstructive sleep apnea (adult) (pediatric): Secondary | ICD-10-CM | POA: Diagnosis not present

## 2017-11-26 NOTE — Procedures (Signed)
  Patient Name: [name], [name] Date: 11/22/2017 Gender: Male D.O.B: Dec 01, 1970 Age (years): 47 Referring Provider: Christia Reading Height (inches): 73 Interpreting Physician: Jetty Duhamel MD, ABSM Weight (lbs): 240 RPSGT: Celene Kras BMI: 32 MRN: [id number] Neck Size: 18.00  CLINICAL INFORMATION Sleep Study Type: HST Indication for sleep study: Snoring  Epworth Sleepiness Score: 12  SLEEP STUDY TECHNIQUE A multi-channel overnight portable sleep study was performed. The channels recorded were: nasal airflow, thoracic respiratory movement, and oxygen saturation with a pulse oximetry. Snoring was also monitored.  MEDICATIONS Patient self administered medications include: none reported.  SLEEP ARCHITECTURE Patient was studied for 375 minutes. The sleep efficiency was 99.7 % and the patient was supine for 76.9%. The arousal index was 0.0 per hour.  RESPIRATORY PARAMETERS The overall AHI was 45.1 per hour, with a central apnea index of 0.0 per hour. The oxygen nadir was 65% during sleep.  CARDIAC DATA Mean heart rate during sleep was 82.0 bpm.  IMPRESSIONS - Severe obstructive sleep apnea occurred during this study (AHI = 45.1/h). - No significant central sleep apnea occurred during this study (CAI = 0.0/h). - Oxygen desaturation was noted during this study (Min O2 = 65%). - Patient snored.  DIAGNOSIS - Obstructive Sleep Apnea (327.23 [G47.33 ICD-10]) - Nocturnal Hypoxemia (327.26 [G47.36 ICD-10])  RECOMMENDATIONS - Suggest CPAP, fitted oral appliance, or ENT measures. Other options would be based on clinical judgment. - Be careful with alcohol, sedatives and other CNS depressants that may worsen sleep apnea and disrupt normal sleep architecture. - Sleep hygiene should be reviewed to assess factors that may improve sleep quality. - Weight management and regular exercise should be initiated or continued  [Electronically signed] 11/26/2017 04:03 PM  Jetty Duhamel MD, ABSM Diplomate, American Board of Sleep Medicine   NPI: [id number]                          Jetty Duhamel Diplomate, American Board of Sleep Medicine  ELECTRONICALLY SIGNED ON:  11/26/2017, 4:01 PM [facility] PH: [phone]   FX: [phone] ACCREDITED BY THE AMERICAN ACADEMY OF SLEEP MEDICINE

## 2017-11-29 DIAGNOSIS — F4323 Adjustment disorder with mixed anxiety and depressed mood: Secondary | ICD-10-CM | POA: Diagnosis not present

## 2017-12-13 DIAGNOSIS — F4323 Adjustment disorder with mixed anxiety and depressed mood: Secondary | ICD-10-CM | POA: Diagnosis not present

## 2017-12-14 DIAGNOSIS — K080 Exfoliation of teeth due to systemic causes: Secondary | ICD-10-CM | POA: Diagnosis not present

## 2017-12-20 DIAGNOSIS — F4323 Adjustment disorder with mixed anxiety and depressed mood: Secondary | ICD-10-CM | POA: Diagnosis not present

## 2017-12-23 DIAGNOSIS — G4733 Obstructive sleep apnea (adult) (pediatric): Secondary | ICD-10-CM | POA: Diagnosis not present

## 2017-12-27 DIAGNOSIS — K080 Exfoliation of teeth due to systemic causes: Secondary | ICD-10-CM | POA: Diagnosis not present

## 2017-12-27 DIAGNOSIS — F4323 Adjustment disorder with mixed anxiety and depressed mood: Secondary | ICD-10-CM | POA: Diagnosis not present

## 2018-01-09 DIAGNOSIS — K080 Exfoliation of teeth due to systemic causes: Secondary | ICD-10-CM | POA: Diagnosis not present

## 2018-01-10 ENCOUNTER — Other Ambulatory Visit: Payer: Self-pay | Admitting: Otolaryngology

## 2018-01-10 DIAGNOSIS — F4323 Adjustment disorder with mixed anxiety and depressed mood: Secondary | ICD-10-CM | POA: Diagnosis not present

## 2018-01-16 ENCOUNTER — Encounter (HOSPITAL_BASED_OUTPATIENT_CLINIC_OR_DEPARTMENT_OTHER): Payer: Self-pay | Admitting: *Deleted

## 2018-01-16 ENCOUNTER — Other Ambulatory Visit: Payer: Self-pay

## 2018-01-17 DIAGNOSIS — F4323 Adjustment disorder with mixed anxiety and depressed mood: Secondary | ICD-10-CM | POA: Diagnosis not present

## 2018-01-26 NOTE — Anesthesia Preprocedure Evaluation (Addendum)
Anesthesia Evaluation  Patient identified by MRN, date of birth, ID band Patient awake    Reviewed: Allergy & Precautions, NPO status , Patient's Chart, lab work & pertinent test results  History of Anesthesia Complications Negative for: history of anesthetic complications  Airway Mallampati: I  TM Distance: >3 FB Neck ROM: Full    Dental  (+) Dental Advisory Given, Teeth Intact   Pulmonary sleep apnea , Current Smoker,    breath sounds clear to auscultation       Cardiovascular Exercise Tolerance: Good negative cardio ROS   Rhythm:Regular Rate:Normal     Neuro/Psych Seizures -, Well Controlled,  PSYCHIATRIC DISORDERS Anxiety  PTSD    GI/Hepatic negative GI ROS, Neg liver ROS,   Endo/Other   Obesity   Renal/GU negative Renal ROS     Musculoskeletal negative musculoskeletal ROS (+)   Abdominal   Peds  Hematology negative hematology ROS (+)   Anesthesia Other Findings   Reproductive/Obstetrics                            Anesthesia Physical Anesthesia Plan  ASA: II  Anesthesia Plan: MAC   Post-op Pain Management:    Induction: Intravenous  PONV Risk Score and Plan: 1 and Propofol infusion and Treatment may vary due to age or medical condition  Airway Management Planned: Nasal Cannula and Natural Airway  Additional Equipment: None  Intra-op Plan:   Post-operative Plan:   Informed Consent: I have reviewed the patients History and Physical, chart, labs and discussed the procedure including the risks, benefits and alternatives for the proposed anesthesia with the patient or authorized representative who has indicated his/her understanding and acceptance.     Plan Discussed with: CRNA and Anesthesiologist  Anesthesia Plan Comments:        Anesthesia Quick Evaluation

## 2018-01-27 ENCOUNTER — Encounter (HOSPITAL_BASED_OUTPATIENT_CLINIC_OR_DEPARTMENT_OTHER)
Admission: RE | Disposition: A | Discharge status: Home / Self Care | Payer: Self-pay | Source: Ambulatory Visit | Attending: Otolaryngology

## 2018-01-27 ENCOUNTER — Other Ambulatory Visit: Payer: Self-pay

## 2018-01-27 ENCOUNTER — Ambulatory Visit (HOSPITAL_BASED_OUTPATIENT_CLINIC_OR_DEPARTMENT_OTHER): Payer: Federal, State, Local not specified - PPO | Admitting: Anesthesiology

## 2018-01-27 ENCOUNTER — Encounter (HOSPITAL_BASED_OUTPATIENT_CLINIC_OR_DEPARTMENT_OTHER): Payer: Self-pay | Admitting: *Deleted

## 2018-01-27 ENCOUNTER — Ambulatory Visit (HOSPITAL_BASED_OUTPATIENT_CLINIC_OR_DEPARTMENT_OTHER)
Admission: RE | Admit: 2018-01-27 | Discharge: 2018-01-27 | Disposition: A | Discharge status: Home / Self Care | Payer: Federal, State, Local not specified - PPO | Source: Ambulatory Visit | Attending: Otolaryngology | Admitting: Otolaryngology

## 2018-01-27 DIAGNOSIS — F17290 Nicotine dependence, other tobacco product, uncomplicated: Secondary | ICD-10-CM | POA: Insufficient documentation

## 2018-01-27 DIAGNOSIS — Z91018 Allergy to other foods: Secondary | ICD-10-CM | POA: Insufficient documentation

## 2018-01-27 DIAGNOSIS — G4733 Obstructive sleep apnea (adult) (pediatric): Secondary | ICD-10-CM | POA: Insufficient documentation

## 2018-01-27 DIAGNOSIS — Z888 Allergy status to other drugs, medicaments and biological substances status: Secondary | ICD-10-CM | POA: Insufficient documentation

## 2018-01-27 HISTORY — PX: DRUG INDUCED ENDOSCOPY: SHX6808

## 2018-01-27 HISTORY — DX: Personal history of urinary calculi: Z87.442

## 2018-01-27 SURGERY — DRUG INDUCED SLEEP ENDOSCOPY
Anesthesia: Monitor Anesthesia Care | Site: Throat

## 2018-01-27 MED ORDER — FENTANYL CITRATE (PF) 100 MCG/2ML IJ SOLN: 50.0000 ug | INTRAMUSCULAR | Status: DC | PRN

## 2018-01-27 MED ORDER — MIDAZOLAM HCL 2 MG/2ML IJ SOLN: 1.0000 mg | INTRAMUSCULAR | Status: DC | PRN

## 2018-01-27 MED ORDER — SCOPOLAMINE 1 MG/3DAYS TD PT72: 1.0000 | MEDICATED_PATCH | Freq: Once | TRANSDERMAL | Status: DC | PRN

## 2018-01-27 MED ORDER — PROPOFOL 500 MG/50ML IV EMUL
INTRAVENOUS | Status: DC | PRN
  Administered 2018-01-27: 75 ug/kg/min via INTRAVENOUS

## 2018-01-27 MED ORDER — LACTATED RINGERS IV SOLN
INTRAVENOUS | Status: DC
  Administered 2018-01-27: 08:00:00 via INTRAVENOUS

## 2018-01-27 MED ORDER — ONDANSETRON HCL 4 MG/2ML IJ SOLN: 4.0000 mg | Freq: Once | INTRAMUSCULAR | Status: DC | PRN

## 2018-01-27 MED ORDER — OXYMETAZOLINE HCL 0.05 % NA SOLN
NASAL | Status: DC | PRN
  Administered 2018-01-27: 1 via TOPICAL

## 2018-01-27 SURGICAL SUPPLY — 12 items
CANISTER SUCT 1200ML W/VALVE (MISCELLANEOUS) ×2 IMPLANT
COVER WAND RF STERILE (DRAPES) IMPLANT
GLOVE BIO SURGEON STRL SZ 6.5 (GLOVE) ×2 IMPLANT
GLOVE BIO SURGEON STRL SZ7.5 (GLOVE) ×2 IMPLANT
NEEDLE PRECISIONGLIDE 27X1.5 (NEEDLE) IMPLANT
PACK BASIN DAY SURGERY FS (CUSTOM PROCEDURE TRAY) ×2 IMPLANT
PATTIES SURGICAL .5 X3 (DISPOSABLE) ×2 IMPLANT
SHEET MEDIUM DRAPE 40X70 STRL (DRAPES) IMPLANT
SOLUTION ANTI FOG 6CC (MISCELLANEOUS) ×2 IMPLANT
SYR CONTROL 10ML LL (SYRINGE) IMPLANT
TOWEL GREEN STERILE FF (TOWEL DISPOSABLE) ×2 IMPLANT
TUBE CONNECTING 20X1/4 (TUBING) ×2 IMPLANT

## 2018-01-27 NOTE — Anesthesia Postprocedure Evaluation (Signed)
Anesthesia Post Note  Patient: Electrical engineer  Procedure(s) Performed: DRUG INDUCED ENDOSCOPY (N/A Throat)     Patient location during evaluation: PACU Anesthesia Type: MAC Level of consciousness: awake and alert Pain management: pain level controlled Vital Signs Assessment: post-procedure vital signs reviewed and stable Respiratory status: spontaneous breathing, nonlabored ventilation and respiratory function stable Cardiovascular status: stable and blood pressure returned to baseline Anesthetic complications: no    Last Vitals:  Vitals:   01/27/18 0845 01/27/18 0853  BP:  140/90  Pulse: 82 91  Resp: (!) 9 16  Temp:  36.7 C  SpO2: 100% 99%    Last Pain:  Vitals:   01/27/18 0701  TempSrc: Oral  PainSc: 0-No pain                 Audry Pili

## 2018-01-27 NOTE — Discharge Instructions (Signed)

## 2018-01-27 NOTE — H&P (Signed)
[name] is an 47 y.o. male.   Chief Complaint: Sleep apnea HPI: 47 year old male with obstructive sleep apnea unable to tolerate CPAP.  He presents for airway evaluation.  Past Medical History:  Diagnosis Date  . History of kidney stones    Due to dehydration  . Multiple allergies   . PTSD (post-traumatic stress disorder)   . Seizures (HCC)    Several years ago  . Sleep apnea     Past Surgical History:  Procedure Laterality Date  . Bones reset     Military  . Removal of Shrapnel     [employer]    History reviewed. No pertinent family history. Social History:  reports that he has been smoking cigars. He has never used smokeless tobacco. He reports that he drinks alcohol. He reports that he does not use drugs.  Allergies:  Allergies  Allergen Reactions  . Pork-Derived Products Anaphylaxis  . Dilantin [Phenytoin] Other (See Comments)    Causes seizure     Medications Prior to Admission  Medication Sig Dispense Refill  . cetirizine (ZYRTEC) 10 MG tablet Take 10 mg by mouth daily.    . Multiple Vitamin (MULTIVITAMIN) tablet Take 1 tablet by mouth daily.      No results found for this or any previous visit (from the past 48 hour(s)). No results found.  Review of Systems  All other systems reviewed and are negative.   Blood pressure 139/86, pulse 71, temperature 97.9 F (36.6 C), temperature source Oral, resp. rate 18, height 6\' 1"  (1.854 m), weight 108.3 kg, SpO2 100 %. Physical Exam  Constitutional: He is oriented to person, place, and time. He appears well-developed and well-nourished. No distress.  HENT:  Head: Normocephalic and atraumatic.  Right Ear: External ear normal.  Left Ear: External ear normal.  Nose: Nose normal.  Mouth/Throat: Oropharynx is clear and moist.  Eyes: Pupils are equal, round, and reactive to light. Conjunctivae and EOM are normal.  Neck: Normal range of motion. Neck supple.  Cardiovascular: Normal rate.  Respiratory: Effort  normal.  Musculoskeletal: Normal range of motion.  Neurological: He is alert and oriented to person, place, and time. No cranial nerve deficit.  Skin: Skin is warm and dry.  Psychiatric: He has a normal mood and affect. His behavior is normal. Judgment and thought content normal.     Assessment/Plan Obstructive sleep apnea To OR for drug-induced sleep endoscopy.  Christia ReadingBATES, Orrie Schubert, MD 01/27/2018, 8:00 AM

## 2018-01-27 NOTE — Op Note (Addendum)
[name], [name] MEDICAL RECORD [id number] ACCOUNT [id number] DATE OF BIRTH:08-12-1970 FACILITY: [facility] LOCATION: [facility] PHYSICIAN:Vonetta Foulk Pearletha Alfred. Lovell Roe, MD  OPERATIVE REPORT  DATE OF PROCEDURE:  01/27/2018  PREOPERATIVE DIAGNOSIS:  Obstructive sleep apnea.  POSTOPERATIVE DIAGNOSIS:  Obstructive sleep apnea.  PROCEDURE:  Drug-induced sleep endoscopy.  SURGEON:  Christia Readingwight Phillip Sandler, MD  ANESTHESIA:  IV sedation.  COMPLICATIONS:  None.  INDICATIONS:  The patient is a 47 year old male with severe obstructive sleep apnea who has been unable to tolerate CPAP.  He presents to the operating room for airway evaluation.  FINDINGS:  The video of his exam was secondarily [facility].  There was concentric collapse at the bottom of the velopharynx but there was 50% anterior-posterior collapse at the genu or top of the velopharynx. Candidacy for hypoglossal nerve stimulation ([facility]) is determined at the genu or top of the velopharynx so the patient is a good candidate.  The more distal concentric collapse is not considered a contraindication.  There was 50% A-P collapse at the oropharnx, 25% at the tongue base, and 100% at the epiglottis.  DESCRIPTION OF PROCEDURE:  The patient was identified in the holding room, informed consent having been obtained including discussion of risks, benefits, alternatives.  The patient was brought to the operative suite and placed on the operating table in  supine position.  Anesthesia was induced.  The patient was maintained via IV sedation.  Once he was asleep, Afrin-soaked pledgets were placed in the right side of the nose and left in place for a few minutes and then removed.  The flexible laryngoscope  was passed through the right nasal passage to view the pharynx and larynx.  Findings are noted above.  Once this was completed, the laryngoscope was removed, and the patient was returned to anesthesia for wakeup.  He was admitted to the recovery  room in  stable condition.  LN/NUANCE  D:01/27/2018 T:01/27/2018 JOB:[id number]

## 2018-01-27 NOTE — Transfer of Care (Signed)
Immediate Anesthesia Transfer of Care Note  Patient: [name]  Procedure(s) Performed: DRUG INDUCED ENDOSCOPY (N/A Throat)  Patient Location: PACU  Anesthesia Type:MAC  Level of Consciousness: awake, alert  and oriented  Airway & Oxygen Therapy: Patient Spontanous Breathing and Patient connected to face mask oxygen  Post-op Assessment: Report given to RN and Post -op Vital signs reviewed and stable  Post vital signs: Reviewed  Last Vitals:  Vitals Value Taken Time  BP 125/92 01/27/2018  8:31 AM  Temp    Pulse 83 01/27/2018  8:33 AM  Resp 7 01/27/2018  8:33 AM  SpO2 97 % 01/27/2018  8:33 AM  Vitals shown include unvalidated device data.  Last Pain:  Vitals:   01/27/18 0701  TempSrc: Oral  PainSc: 0-No pain      Patients Stated Pain Goal: 0 (33/35/45 6256)  Complications: No apparent anesthesia complications

## 2018-01-27 NOTE — Brief Op Note (Signed)
01/27/2018  8:30 AM  PATIENT:  [name]  47 y.o. male  PRE-OPERATIVE DIAGNOSIS:  obstructive sleep apnea  POST-OPERATIVE DIAGNOSIS:  obstructive sleep apnea  PROCEDURE:  Procedure(s): DRUG INDUCED ENDOSCOPY (N/A)  SURGEON:  Surgeon(s) and Role:    Christia Reading* Donnavin Vandenbrink, MD - Primary  PHYSICIAN ASSISTANT:   ASSISTANTS: none   ANESTHESIA:   IV sedation  EBL:  None  BLOOD ADMINISTERED:none  DRAINS: none   LOCAL MEDICATIONS USED:  NONE  SPECIMEN:  No Specimen  DISPOSITION OF SPECIMEN:  N/A  COUNTS:  YES  TOURNIQUET:  * No tourniquets in log *  DICTATION: .Other Dictation: Dictation Number [id number]  PLAN OF CARE: Discharge to home after PACU  PATIENT DISPOSITION:  PACU - hemodynamically stable.   Delay start of Pharmacological VTE agent (>24hrs) due to surgical blood loss or risk of bleeding: no

## 2018-01-30 ENCOUNTER — Encounter (HOSPITAL_BASED_OUTPATIENT_CLINIC_OR_DEPARTMENT_OTHER): Payer: Self-pay | Admitting: Otolaryngology

## 2018-02-07 DIAGNOSIS — F4323 Adjustment disorder with mixed anxiety and depressed mood: Secondary | ICD-10-CM | POA: Diagnosis not present

## 2018-02-28 DIAGNOSIS — F4323 Adjustment disorder with mixed anxiety and depressed mood: Secondary | ICD-10-CM | POA: Diagnosis not present

## 2018-03-07 DIAGNOSIS — F4323 Adjustment disorder with mixed anxiety and depressed mood: Secondary | ICD-10-CM | POA: Diagnosis not present

## 2018-03-15 DIAGNOSIS — K080 Exfoliation of teeth due to systemic causes: Secondary | ICD-10-CM | POA: Diagnosis not present

## 2018-03-21 DIAGNOSIS — F4323 Adjustment disorder with mixed anxiety and depressed mood: Secondary | ICD-10-CM | POA: Diagnosis not present

## 2018-03-28 DIAGNOSIS — F4323 Adjustment disorder with mixed anxiety and depressed mood: Secondary | ICD-10-CM | POA: Diagnosis not present

## 2018-04-11 DIAGNOSIS — F4323 Adjustment disorder with mixed anxiety and depressed mood: Secondary | ICD-10-CM | POA: Diagnosis not present

## 2018-04-18 DIAGNOSIS — J069 Acute upper respiratory infection, unspecified: Secondary | ICD-10-CM | POA: Diagnosis not present

## 2018-04-18 DIAGNOSIS — F4323 Adjustment disorder with mixed anxiety and depressed mood: Secondary | ICD-10-CM | POA: Diagnosis not present

## 2018-05-02 DIAGNOSIS — F4323 Adjustment disorder with mixed anxiety and depressed mood: Secondary | ICD-10-CM | POA: Diagnosis not present

## 2018-05-09 DIAGNOSIS — F4323 Adjustment disorder with mixed anxiety and depressed mood: Secondary | ICD-10-CM | POA: Diagnosis not present

## 2018-05-16 DIAGNOSIS — F4323 Adjustment disorder with mixed anxiety and depressed mood: Secondary | ICD-10-CM | POA: Diagnosis not present

## 2018-05-23 DIAGNOSIS — F4323 Adjustment disorder with mixed anxiety and depressed mood: Secondary | ICD-10-CM | POA: Diagnosis not present

## 2018-05-30 DIAGNOSIS — F4323 Adjustment disorder with mixed anxiety and depressed mood: Secondary | ICD-10-CM | POA: Diagnosis not present

## 2018-06-06 DIAGNOSIS — F4323 Adjustment disorder with mixed anxiety and depressed mood: Secondary | ICD-10-CM | POA: Diagnosis not present

## 2018-06-13 DIAGNOSIS — F4323 Adjustment disorder with mixed anxiety and depressed mood: Secondary | ICD-10-CM | POA: Diagnosis not present

## 2018-06-20 DIAGNOSIS — F4323 Adjustment disorder with mixed anxiety and depressed mood: Secondary | ICD-10-CM | POA: Diagnosis not present

## 2018-07-04 DIAGNOSIS — F4323 Adjustment disorder with mixed anxiety and depressed mood: Secondary | ICD-10-CM | POA: Diagnosis not present

## 2018-07-11 DIAGNOSIS — F4323 Adjustment disorder with mixed anxiety and depressed mood: Secondary | ICD-10-CM | POA: Diagnosis not present

## 2018-07-18 DIAGNOSIS — F4323 Adjustment disorder with mixed anxiety and depressed mood: Secondary | ICD-10-CM | POA: Diagnosis not present

## 2018-07-25 DIAGNOSIS — F4323 Adjustment disorder with mixed anxiety and depressed mood: Secondary | ICD-10-CM | POA: Diagnosis not present

## 2018-08-01 DIAGNOSIS — F4323 Adjustment disorder with mixed anxiety and depressed mood: Secondary | ICD-10-CM | POA: Diagnosis not present

## 2018-08-08 DIAGNOSIS — F4323 Adjustment disorder with mixed anxiety and depressed mood: Secondary | ICD-10-CM | POA: Diagnosis not present

## 2018-08-09 ENCOUNTER — Telehealth: Payer: Self-pay | Admitting: Internal Medicine

## 2018-08-09 NOTE — Telephone Encounter (Signed)
Returned call to patient's spouse and notified he is not a patient here. Dr. Annamaria Boots is one of our sleep specialist and he did read the patient's sleep study. It appears ENT provider was the one who ordered the study. She will reach out to that provider. Nothing further needed.

## 2018-08-22 DIAGNOSIS — F4323 Adjustment disorder with mixed anxiety and depressed mood: Secondary | ICD-10-CM | POA: Diagnosis not present

## 2018-08-29 DIAGNOSIS — F4323 Adjustment disorder with mixed anxiety and depressed mood: Secondary | ICD-10-CM | POA: Diagnosis not present

## 2018-09-05 DIAGNOSIS — F4323 Adjustment disorder with mixed anxiety and depressed mood: Secondary | ICD-10-CM | POA: Diagnosis not present

## 2018-09-12 DIAGNOSIS — F4323 Adjustment disorder with mixed anxiety and depressed mood: Secondary | ICD-10-CM | POA: Diagnosis not present

## 2018-09-19 DIAGNOSIS — F4323 Adjustment disorder with mixed anxiety and depressed mood: Secondary | ICD-10-CM | POA: Diagnosis not present

## 2018-09-26 DIAGNOSIS — F4323 Adjustment disorder with mixed anxiety and depressed mood: Secondary | ICD-10-CM | POA: Diagnosis not present

## 2018-10-02 ENCOUNTER — Telehealth: Payer: Self-pay | Admitting: *Deleted

## 2018-10-02 NOTE — Telephone Encounter (Signed)

## 2018-10-03 DIAGNOSIS — F4323 Adjustment disorder with mixed anxiety and depressed mood: Secondary | ICD-10-CM | POA: Diagnosis not present

## 2018-10-04 ENCOUNTER — Telehealth: Payer: Federal, State, Local not specified - PPO | Admitting: Cardiology

## 2018-10-04 ENCOUNTER — Other Ambulatory Visit: Payer: Self-pay

## 2018-10-04 NOTE — Progress Notes (Deleted)
Virtual Visit via Video Note   This visit type was conducted due to national recommendations for restrictions regarding the COVID-19 Pandemic (e.g. social distancing) in an effort to limit this patient's exposure and mitigate transmission in our community.  Due to his co-morbid illnesses, this patient is at least at moderate risk for complications without adequate follow up.  This format is felt to be most appropriate for this patient at this time.  All issues noted in this document were discussed and addressed.  A limited physical exam was performed with this format.  Please refer to the patient's chart for his consent to telehealth for [facility].  Evaluation Performed:  Follow-up visit  This visit type was conducted due to national recommendations for restrictions regarding the COVID-19 Pandemic (e.g. social distancing).  This format is felt to be most appropriate for this patient at this time.  All issues noted in this document were discussed and addressed.  No physical exam was performed (except for noted visual exam findings with Video Visits).  Please refer to the patient's chart (MyChart message for video visits and phone note for telephone visits) for the patient's consent to telehealth for [facility].  Date:  10/04/2018   ID:  [name], DOB 1970/05/27, MRN [id number]  Patient Location:  Home  Provider location:   [place]  PCP:  Lucianne Lei, MD  Cardiologist:  Fransico Him, MD Electrophysiologist:  None   Chief Complaint:  ***  History of Present Illness:    [name] is a 48 y.o. male who presents via audio/video conferencing for a telehealth visit today.    This is a 48yo male with a hx of OSA who has been intolerant to CPAP.    The patient does not have symptoms concerning for COVID-19 infection (fever, chills, cough, or new shortness of breath).    Prior CV studies:   The following studies were reviewed today:  ***  Past Medical History:   Diagnosis Date  . History of kidney stones    Due to dehydration  . Multiple allergies   . PTSD (post-traumatic stress disorder)   . Seizures ([place])    Several years ago  . Sleep apnea    Past Surgical History:  Procedure Laterality Date  . Bones reset     [employer]  . DRUG INDUCED ENDOSCOPY N/A 01/27/2018   Procedure: DRUG INDUCED ENDOSCOPY;  Surgeon: Melida Quitter, MD;  Location: [place];  Service: ENT;  Laterality: N/A;  . Removal of Shrapnel     [employer]     No outpatient medications have been marked as taking for the 10/04/18 encounter (Appointment) with Sueanne Margarita, MD.     Allergies:   Pork-derived products and Dilantin [phenytoin]   Social History   Tobacco Use  . Smoking status: Current Some Day Smoker    Types: Cigars  . Smokeless tobacco: Never Used  . Tobacco comment: Smokes 1 cigar every 2 weeks  Substance Use Topics  . Alcohol use: Yes  . Drug use: No     Family Hx: The patient's family history is not on file.  ROS:   Please see the history of present illness.    *** All other systems reviewed and are negative.   Labs/Other Tests and Data Reviewed:    Recent Labs: No results found for requested labs within last 8760 hours.   Recent Lipid Panel No results found for: CHOL, TRIG, HDL, CHOLHDL, LDLCALC, LDLDIRECT  Wt Readings from Last 3  Encounters:  01/27/18 238 lb 12.1 oz (108.3 kg)  11/22/17 240 lb (108.9 kg)     Objective:    Vital Signs:  There were no vitals taken for this visit.   CONSTITUTIONAL:  Well nourished, well developed male in no*** acute distress.  EYES: anicteric MOUTH: oral mucosa is pink RESPIRATORY: Normal respiratory effort, symmetric expansion CARDIOVASCULAR: No peripheral edema SKIN: No rash, lesions or ulcers MUSCULOSKELETAL: no digital cyanosis NEURO: Cranial Nerves II-XII grossly intact, moves all extremities PSYCH: Intact judgement and insight.  A&O x 3, Mood/affect appropriate    ASSESSMENT & PLAN:    1.  ***  COVID-19 Education: The signs and symptoms of COVID-19 were discussed with the patient and how to seek care for testing (follow up with PCP or arrange E-visit).  The importance of social distancing was discussed today.  Patient Risk:   After full review of this patient's clinical status, I feel that they are at least moderate risk at this time.  Time:   Today, I have spent *** minutes directly with the patient on *** discussing medical problems including ***.  We also reviewed the symptoms of COVID 19 and the ways to protect against contracting the virus with telehealth technology.  I spent an additional *** minutes reviewing patient's chart including ***.  Medication Adjustments/Labs and Tests Ordered: Current medicines are reviewed at length with the patient today.  Concerns regarding medicines are outlined above.  Tests Ordered: No orders of the defined types were placed in this encounter.  Medication Changes: No orders of the defined types were placed in this encounter.   Disposition:  Follow up {follow up:15908}  Signed, Armanda Magicraci , MD  10/04/2018 7:20 AM    [facility]
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
## 2018-10-06 ENCOUNTER — Telehealth (INDEPENDENT_AMBULATORY_CARE_PROVIDER_SITE_OTHER): Payer: Federal, State, Local not specified - PPO | Admitting: Cardiology

## 2018-10-06 ENCOUNTER — Other Ambulatory Visit: Payer: Self-pay

## 2018-10-06 ENCOUNTER — Encounter: Payer: Self-pay | Admitting: Cardiology

## 2018-10-06 DIAGNOSIS — G4733 Obstructive sleep apnea (adult) (pediatric): Secondary | ICD-10-CM | POA: Diagnosis not present

## 2018-10-06 NOTE — Patient Instructions (Signed)
Medication Instructions:  Your physician recommends that you continue on your current medications as directed. Please refer to the Current Medication list given to you today.  If you need a refill on your cardiac medications before your next appointment, please call your pharmacy.   Lab work: None Ordered  If you have labs (blood work) drawn today and your tests are completely normal, you will receive your results only by: Marland Kitchen MyChart Message (if you have MyChart) OR . A paper copy in the mail If you have any lab test that is abnormal or we need to change your treatment, we will call you to review the results.  Testing/Procedures: None ordered  Follow-Up: . No follow up needed until after you get your Inspire device  Any Other Special Instructions Will Be Listed Below (If Applicable).

## 2018-10-09 DIAGNOSIS — F4323 Adjustment disorder with mixed anxiety and depressed mood: Secondary | ICD-10-CM | POA: Diagnosis not present

## 2018-10-12 ENCOUNTER — Institutional Professional Consult (permissible substitution): Payer: Federal, State, Local not specified - PPO | Admitting: Neurology

## 2018-10-17 DIAGNOSIS — F4323 Adjustment disorder with mixed anxiety and depressed mood: Secondary | ICD-10-CM | POA: Diagnosis not present

## 2018-10-24 DIAGNOSIS — F4323 Adjustment disorder with mixed anxiety and depressed mood: Secondary | ICD-10-CM | POA: Diagnosis not present

## 2018-10-31 DIAGNOSIS — G4733 Obstructive sleep apnea (adult) (pediatric): Secondary | ICD-10-CM | POA: Diagnosis not present

## 2018-10-31 DIAGNOSIS — F4323 Adjustment disorder with mixed anxiety and depressed mood: Secondary | ICD-10-CM | POA: Diagnosis not present

## 2018-11-07 DIAGNOSIS — F4323 Adjustment disorder with mixed anxiety and depressed mood: Secondary | ICD-10-CM | POA: Diagnosis not present

## 2018-11-14 DIAGNOSIS — F4323 Adjustment disorder with mixed anxiety and depressed mood: Secondary | ICD-10-CM | POA: Diagnosis not present

## 2018-11-28 DIAGNOSIS — F4323 Adjustment disorder with mixed anxiety and depressed mood: Secondary | ICD-10-CM | POA: Diagnosis not present

## 2018-12-12 DIAGNOSIS — F4323 Adjustment disorder with mixed anxiety and depressed mood: Secondary | ICD-10-CM | POA: Diagnosis not present

## 2018-12-12 IMAGING — MR MR LUMBAR SPINE W/O CM
5 series · 28 of 48 positions shown · non-contrast
Comparison: None.

CLINICAL DATA: Nine months of low back pain that is left sided and
radiates from left hip into entire left leg. Numbness in left leg.
No known injury, surgery or injections.

EXAM:
MRI LUMBAR SPINE WITHOUT CONTRAST
TECHNIQUE: Multiplanar, multisequence MR imaging of the lumbar spine was
performed. No intravenous contrast was administered.

[Series 6: T2 · sagittal · 4.0mm · 0.73mm/px · 6 of 16 slices shown (1 of 2)]
[im 1/16]
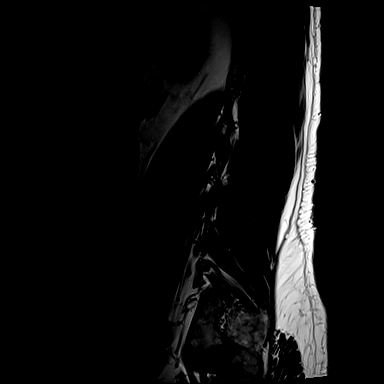
[im 4/16]
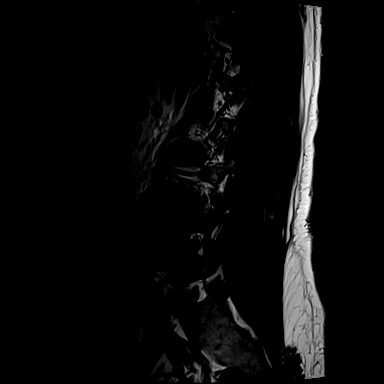
[im 7/16]
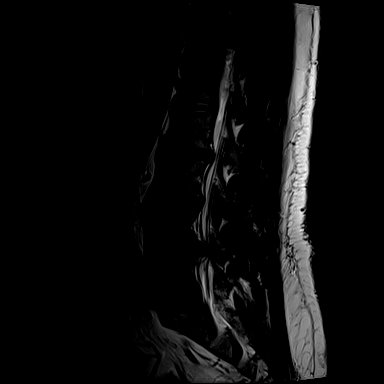
[im 10/16]
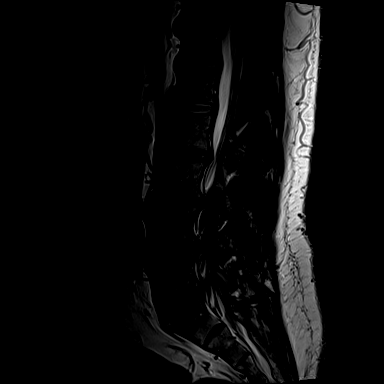
[im 13/16]
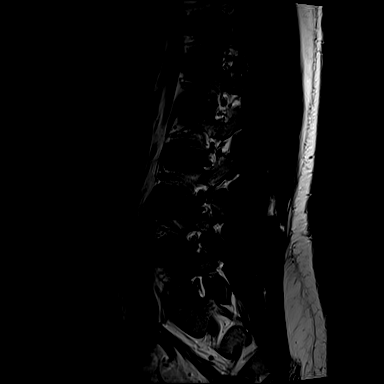
[im 16/16]
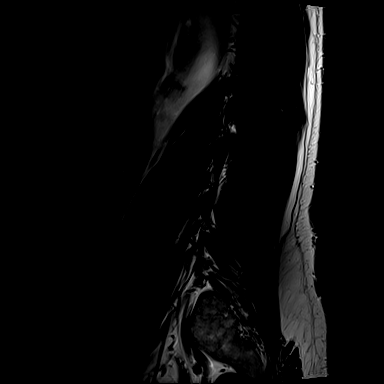

[Series 7: STIR · sagittal · 4.0mm · 0.88mm/px · 6 of 16 slices shown]
[im 1/16]
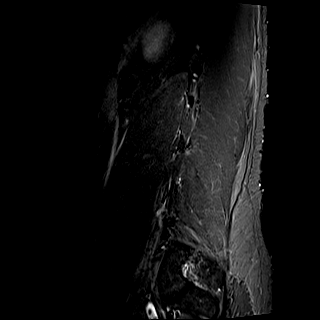
[im 4/16]
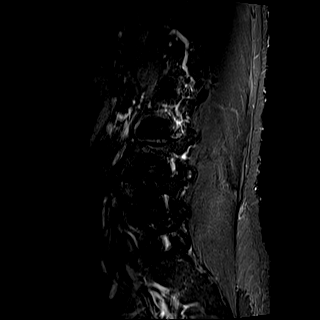
[im 7/16]
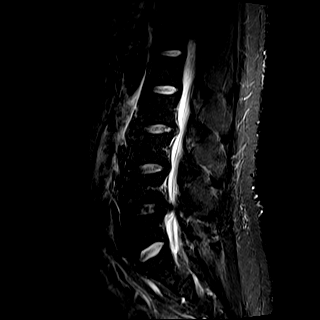
[im 10/16]
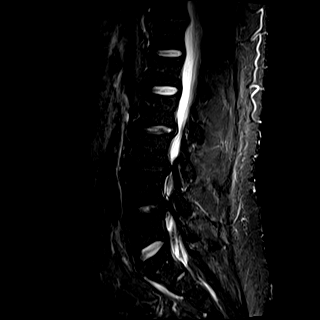
[im 13/16]
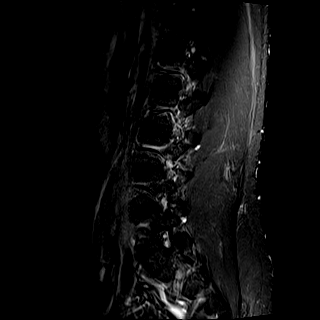
[im 16/16]
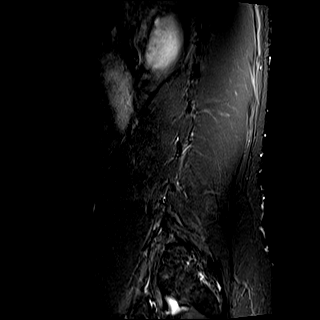

[Series 8: T1 · sagittal · 4.0mm · 0.73mm/px · 6 of 16 slices shown]
[im 1/16]
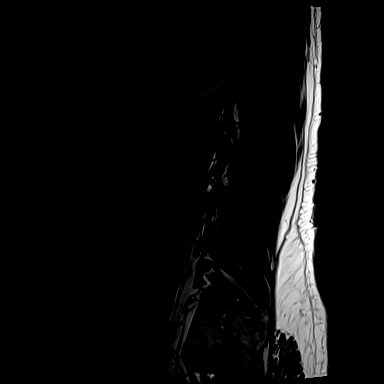
[im 4/16]
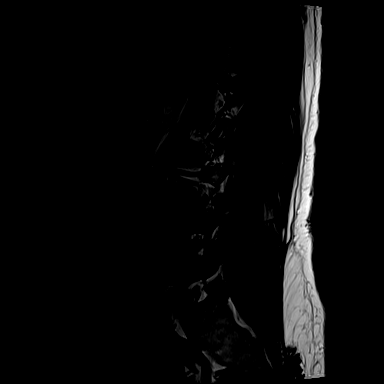
[im 7/16]
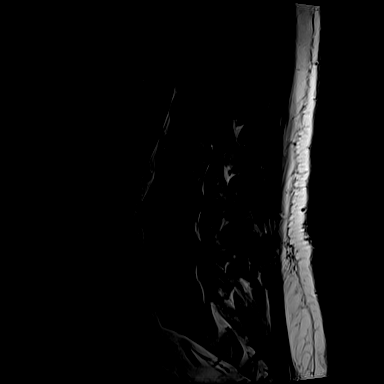
[im 10/16]
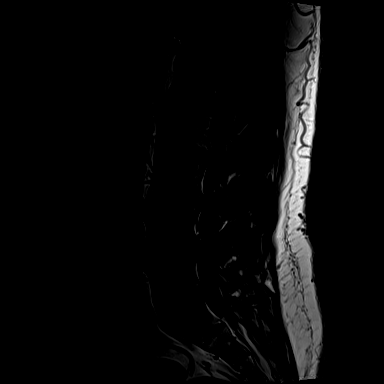
[im 13/16]
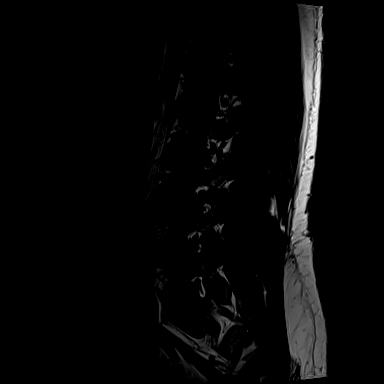
[im 16/16]
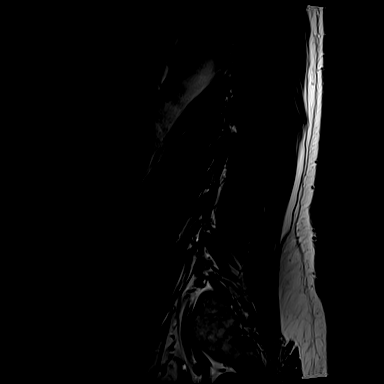

[Series 11: t1_tse_tra · axial · 4.0mm · 0.59mm/px · 1 of 41 slices shown]
[im 1/41]
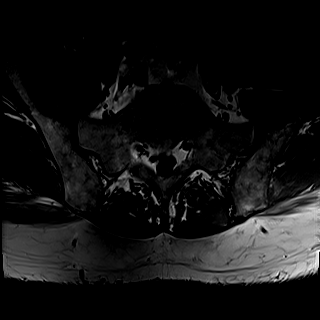

[Series 14: T2 · axial · 4.0mm · 0.35mm/px · z∈[-77,+133]mm · 9 of 41 slices shown (2 of 2)]
[im 1/41]
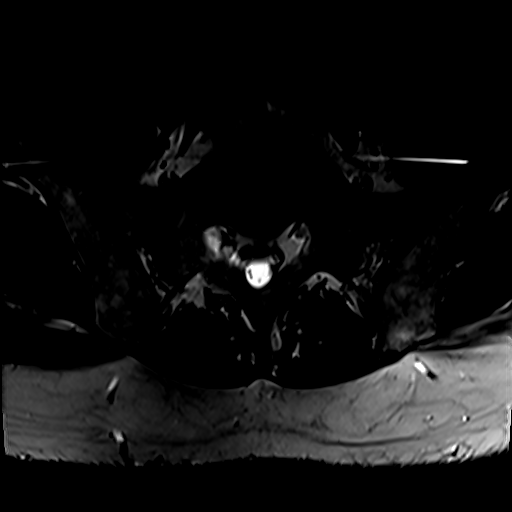
[im 6/41]
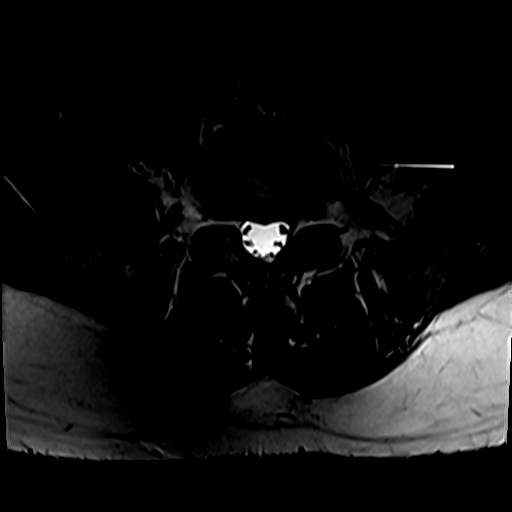
[im 12/41]
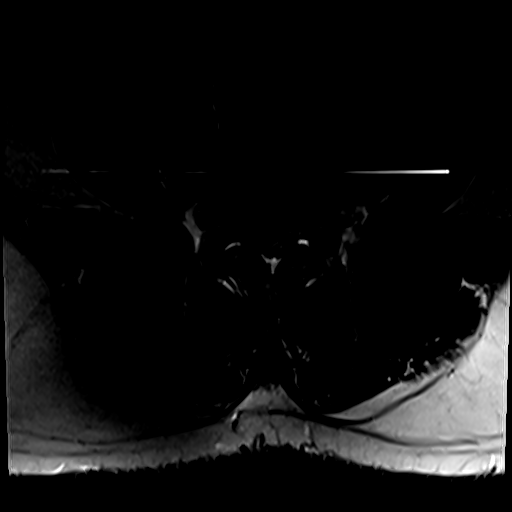
[im 18/41]
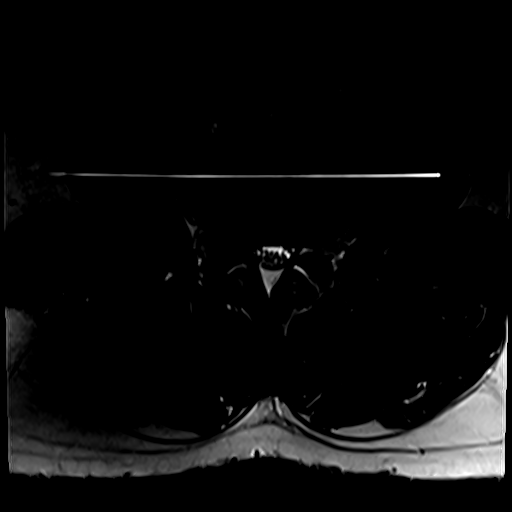
[im 21/41]
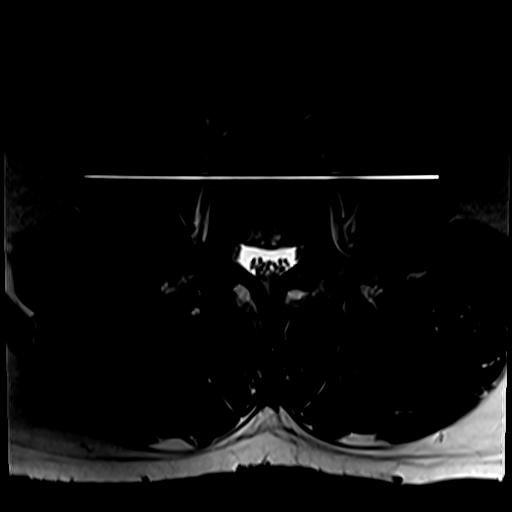
[im 23/41]
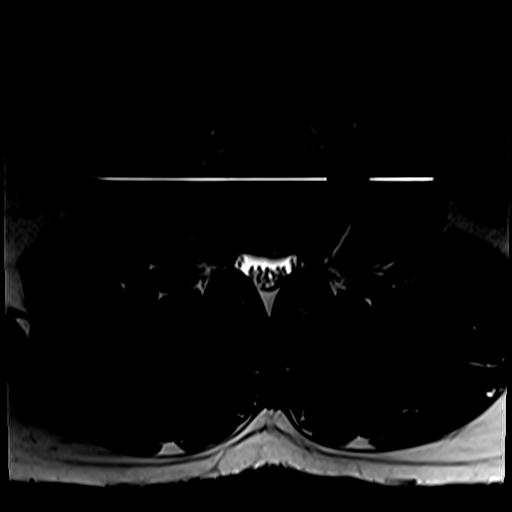
[im 29/41]
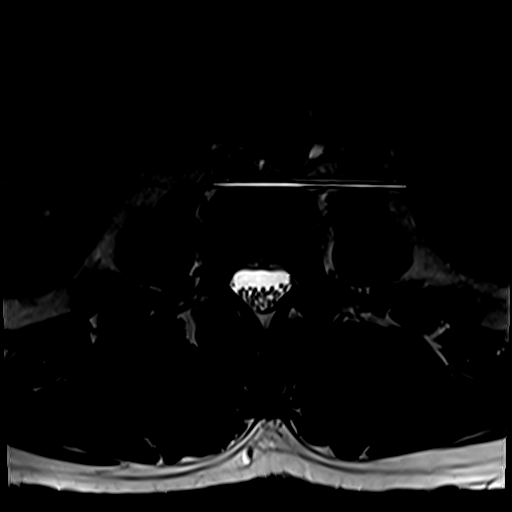
[im 35/41]
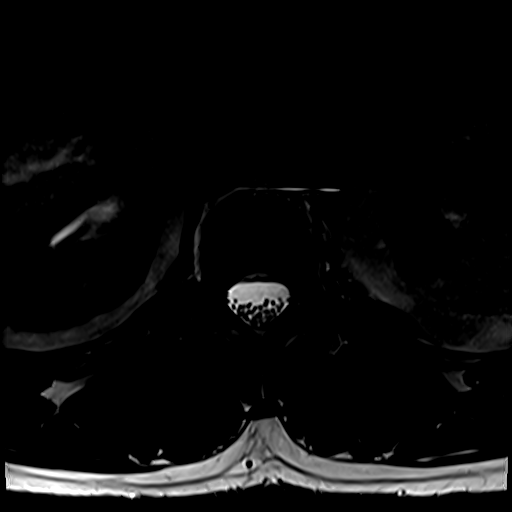
[im 41/41]
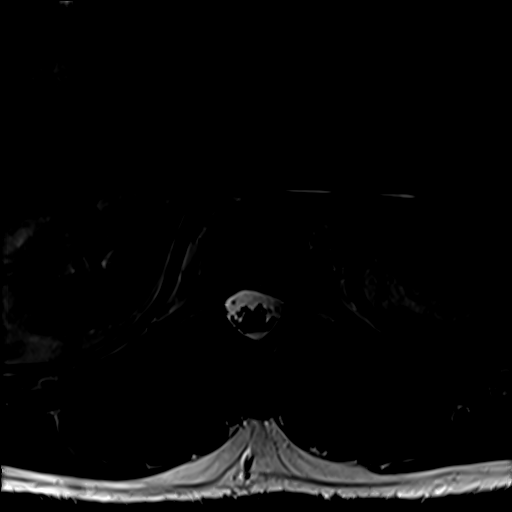

[28 of 48 positions shown; findings below may reference images not displayed]

FINDINGS: Segmentation:  Standard.

Alignment:  Physiologic.

Vertebrae:  No fracture, evidence of discitis, or bone lesion.

Conus medullaris: Extends to the T12 level and appears normal.

Paraspinal and other soft tissues: No paraspinal abnormality.

Disc levels:

Disc spaces: Disc desiccation at L4-5.

T11-12: Mild broad-based disc bulge. No foraminal or central canal
stenosis.

T12-L1: No significant disc bulge. No evidence of neural foraminal
stenosis. No central canal stenosis.

L1-L2: No significant disc bulge. No evidence of neural foraminal
stenosis. No central canal stenosis.

L2-L3: Broad central disc protrusion. No evidence of neural
foraminal stenosis. No central canal stenosis.

L3-L4: Mild broad-based disc bulge flattening the ventral thecal
sac. Right lateral recess narrowing. Mild bilateral facet
arthropathy. Mild bilateral foraminal stenosis. Mild central canal
stenosis.

L4-L5: Central/right paracentral disc protrusion. Bilateral lateral
recess narrowing. Severe spinal stenosis. Moderate bilateral facet
arthropathy. Moderate right foraminal stenosis.

L5-S1: No significant disc bulge. No evidence of neural foraminal
stenosis. No central canal stenosis.
IMPRESSION: 1. At L4-5 there is a central/right paracentral disc protrusion.
Bilateral lateral recess narrowing. Severe spinal stenosis. Moderate
bilateral facet arthropathy. Moderate right foraminal stenosis.
2. At L3-4 there is a mild broad-based disc bulge flattening the
ventral thecal sac. Right lateral recess narrowing. Mild bilateral
facet arthropathy. Mild bilateral foraminal stenosis. Mild central
canal stenosis.

## 2018-12-19 ENCOUNTER — Other Ambulatory Visit: Payer: Self-pay | Admitting: Otolaryngology

## 2018-12-19 DIAGNOSIS — F4323 Adjustment disorder with mixed anxiety and depressed mood: Secondary | ICD-10-CM | POA: Diagnosis not present

## 2018-12-20 NOTE — Progress Notes (Signed)
[facility] [place], [place] AT [facility] [address] [place] [address] Phone: [phone] Fax: [phone]      Your procedure is scheduled on Wednesday 12/27/2018.  Report to [facility] Main Entrance "A" at 09:15 A.M., and check in at the Admitting office.  Call this number if you have problems the morning of surgery:  [phone]  Call [phone] if you have any questions prior to your surgery date Monday-Friday 8am-4pm    Remember:  Do not eat or drink after midnight the night before your surgery   Take these medicines the morning of surgery with A SIP OF WATER:  Cetirizine (Zyrtec) Fluticasone (Flonase) - if needed  7 days prior to surgery STOP taking any Aspirin (unless otherwise instructed by your surgeon), Aleve, Naproxen, Ibuprofen, Motrin, Advil, Goody's, BC's, all herbal medications, fish oil, and all vitamins.    The Morning of Surgery  Do not wear jewelry, make-up or nail polish.  Do not wear lotions, powders, colognes, or deodorant  Men may shave face and neck.  Do not bring valuables to the hospital.  [facility] is not responsible for any belongings or valuables.  If you are a smoker, DO NOT Smoke 24 hours prior to surgery  If you wear a CPAP at night please bring your mask, tubing, and machine the morning of surgery   Remember that you must have someone to transport you home after your surgery, and remain with you for 24 hours if you are discharged the same day.   Contacts, eyeglasses, hearing aids, dentures or bridgework may not be worn into surgery.    Leave your suitcase in the car.  After surgery it may be brought to your room.  For patients admitted to the hospital, discharge time will be determined by your treatment team.  Patients discharged the day of surgery will not be allowed to drive home.    Special instructions:   [place]- Preparing For Surgery  Before surgery,  you can play an important role. Because skin is not sterile, your skin needs to be as free of germs as possible. You can reduce the number of germs on your skin by washing with CHG (chlorahexidine gluconate) Soap before surgery.  CHG is an antiseptic cleaner which kills germs and bonds with the skin to continue killing germs even after washing.    Oral Hygiene is also important to reduce your risk of infection.  Remember - BRUSH YOUR TEETH THE MORNING OF SURGERY WITH YOUR REGULAR TOOTHPASTE  Please do not use if you have an allergy to CHG or antibacterial soaps. If your skin becomes reddened/irritated stop using the CHG.  Do not shave (including legs and underarms) for at least 48 hours prior to first CHG shower. It is OK to shave your face.  Please follow these instructions carefully.   1. Shower the NIGHT BEFORE SURGERY and the MORNING OF SURGERY with CHG Soap.   2. If you chose to wash your hair, wash your hair first as usual with your normal shampoo.  3. After you shampoo, rinse your hair and body thoroughly to remove the shampoo.  4. Use CHG as you would any other liquid soap. You can apply CHG directly to the skin and wash gently with a scrungie or a clean washcloth.   5. Apply the CHG Soap to your body ONLY FROM THE NECK DOWN.  Do not use on open  wounds or open sores. Avoid contact with your eyes, ears, mouth and genitals (private parts). Wash Face and genitals (private parts)  with your normal soap.   6. Wash thoroughly, paying special attention to the area where your surgery will be performed.  7. Thoroughly rinse your body with warm water from the neck down.  8. DO NOT shower/wash with your normal soap after using and rinsing off the CHG Soap.  9. Pat yourself dry with a CLEAN TOWEL.  10. Wear CLEAN PAJAMAS to bed the night before surgery, wear comfortable clothes the morning of surgery  11. Place CLEAN SHEETS on your bed the night of your first shower and DO NOT SLEEP WITH  PETS.    Day of Surgery:  Please shower the morning of surgery with the CHG soap Do not apply any deodorants/lotions.  Please wear clean clothes to the hospital/surgery center.   Remember to brush your teeth WITH YOUR REGULAR TOOTHPASTE.   Please read over the following fact sheets that you were given.

## 2018-12-21 ENCOUNTER — Other Ambulatory Visit: Payer: Self-pay

## 2018-12-21 ENCOUNTER — Encounter (HOSPITAL_COMMUNITY): Payer: Self-pay

## 2018-12-21 ENCOUNTER — Encounter (HOSPITAL_COMMUNITY)
Admission: RE | Admit: 2018-12-21 | Discharge: 2018-12-21 | Disposition: A | Discharge status: Home / Self Care | Payer: Federal, State, Local not specified - PPO | Source: Ambulatory Visit | Attending: Otolaryngology | Admitting: Otolaryngology

## 2018-12-21 DIAGNOSIS — Z01812 Encounter for preprocedural laboratory examination: Secondary | ICD-10-CM | POA: Diagnosis not present

## 2018-12-21 LAB — CBC
HCT: 43.1 % (ref 39.0–52.0)
Hemoglobin: 14.7 g/dL (ref 13.0–17.0)
MCH: 30.8 pg (ref 26.0–34.0)
MCHC: 34.1 g/dL (ref 30.0–36.0)
MCV: 90.4 fL (ref 80.0–100.0)
Platelets: 229 10*3/uL (ref 150–400)
RBC: 4.77 MIL/uL (ref 4.22–5.81)
RDW: 12.7 % (ref 11.5–15.5)
WBC: 5.9 10*3/uL (ref 4.0–10.5)
nRBC: 0 % (ref 0.0–0.2)

## 2018-12-21 LAB — BASIC METABOLIC PANEL
Anion gap: 2 — ABNORMAL LOW (ref 5–15)
BUN: 7 mg/dL (ref 6–20)
CO2: 23 mmol/L (ref 22–32)
Calcium: 9.1 mg/dL (ref 8.9–10.3)
Chloride: 110 mmol/L (ref 98–111)
Creatinine, Ser: 0.92 mg/dL (ref 0.61–1.24)
GFR calc Af Amer: >60 mL/min (ref >60)
GFR calc non Af Amer: >60 mL/min (ref >60)
Glucose, Bld: 109 mg/dL — ABNORMAL HIGH (ref 70–99)
Potassium: 4 mmol/L (ref 3.5–5.1)
Sodium: 135 mmol/L (ref 135–145)

## 2018-12-21 NOTE — Progress Notes (Signed)
PCP - Lucianne Lei, MD Cardiologist - denies  Chest x-ray -N/A EKG - N/A Stress Test - denies  ECHO - denies Cardiac Cath - denies  Sleep Study - 11/2017 CPAP - NO; PTSD from [employer], unable to wear  Blood Thinner Instructions: N/A Aspirin Instructions: N/A  COVID TEST- scheduled 10/31; patient is aware of need for self-quarantine and states verbal understanding  Coronavirus Screening  Have you experienced the following symptoms:  Cough yes/no: No Fever (>100.55F)  yes/no: No Runny nose yes/no: No Sore throat yes/no: No Difficulty breathing/shortness of breath  yes/no: No  Have you or a family member traveled in the last 14 days and where? yes/no: No  If the patient indicates "YES" to the above questions, their PAT will be rescheduled to limit the exposure to others and, the surgeon will be notified. THE PATIENT WILL NEED TO BE ASYMPTOMATIC FOR 14 DAYS.   If the patient is not experiencing any of these symptoms, the PAT nurse will instruct them to NOT bring anyone with them to their appointment since they may have these symptoms or traveled as well.   Please remind your patients and families that hospital visitation restrictions are in effect and the importance of the restrictions.   Anesthesia review: No  Patient denies shortness of breath, fever, cough and chest pain at PAT appointment   All instructions explained to the patient, with a verbal understanding of the material. Patient agrees to go over the instructions while at home for a better understanding. Patient also instructed to self quarantine after being tested for COVID-19. The opportunity to ask questions was provided.

## 2018-12-23 ENCOUNTER — Other Ambulatory Visit (HOSPITAL_COMMUNITY)
Admission: RE | Admit: 2018-12-23 | Discharge: 2018-12-23 | Disposition: A | Discharge status: Home / Self Care | Payer: Federal, State, Local not specified - PPO | Source: Ambulatory Visit | Attending: Otolaryngology | Admitting: Otolaryngology

## 2018-12-23 DIAGNOSIS — Z20828 Contact with and (suspected) exposure to other viral communicable diseases: Secondary | ICD-10-CM | POA: Diagnosis not present

## 2018-12-23 DIAGNOSIS — Z01812 Encounter for preprocedural laboratory examination: Secondary | ICD-10-CM | POA: Diagnosis not present

## 2018-12-24 LAB — NOVEL CORONAVIRUS, NAA (HOSP ORDER, SEND-OUT TO REF LAB; TAT 18-24 HRS): SARS-CoV-2, NAA: NOT DETECTED

## 2018-12-27 ENCOUNTER — Encounter (HOSPITAL_COMMUNITY): Payer: Self-pay

## 2018-12-27 ENCOUNTER — Other Ambulatory Visit: Payer: Self-pay

## 2018-12-27 ENCOUNTER — Ambulatory Visit (HOSPITAL_COMMUNITY): Payer: Federal, State, Local not specified - PPO

## 2018-12-27 ENCOUNTER — Encounter (HOSPITAL_COMMUNITY)
Admission: RE | Disposition: A | Discharge status: Home / Self Care | Payer: Self-pay | Source: Home / Self Care | Attending: Otolaryngology

## 2018-12-27 ENCOUNTER — Ambulatory Visit (HOSPITAL_COMMUNITY): Payer: Federal, State, Local not specified - PPO | Admitting: Certified Registered"

## 2018-12-27 ENCOUNTER — Ambulatory Visit (HOSPITAL_COMMUNITY)
Admission: RE | Admit: 2018-12-27 | Discharge: 2018-12-27 | Disposition: A | Discharge status: Home / Self Care | Payer: Federal, State, Local not specified - PPO | Attending: Otolaryngology | Admitting: Otolaryngology

## 2018-12-27 DIAGNOSIS — J9811 Atelectasis: Secondary | ICD-10-CM | POA: Diagnosis not present

## 2018-12-27 DIAGNOSIS — G4733 Obstructive sleep apnea (adult) (pediatric): Secondary | ICD-10-CM | POA: Insufficient documentation

## 2018-12-27 DIAGNOSIS — Z4542 Encounter for adjustment and management of neuropacemaker (brain) (peripheral nerve) (spinal cord): Secondary | ICD-10-CM | POA: Diagnosis not present

## 2018-12-27 DIAGNOSIS — F4310 Post-traumatic stress disorder, unspecified: Secondary | ICD-10-CM | POA: Insufficient documentation

## 2018-12-27 DIAGNOSIS — E669 Obesity, unspecified: Secondary | ICD-10-CM | POA: Diagnosis not present

## 2018-12-27 DIAGNOSIS — Z79899 Other long term (current) drug therapy: Secondary | ICD-10-CM | POA: Diagnosis not present

## 2018-12-27 DIAGNOSIS — R569 Unspecified convulsions: Secondary | ICD-10-CM | POA: Insufficient documentation

## 2018-12-27 DIAGNOSIS — Z6831 Body mass index (BMI) 31.0-31.9, adult: Secondary | ICD-10-CM | POA: Insufficient documentation

## 2018-12-27 DIAGNOSIS — Z87442 Personal history of urinary calculi: Secondary | ICD-10-CM | POA: Insufficient documentation

## 2018-12-27 DIAGNOSIS — Z9989 Dependence on other enabling machines and devices: Secondary | ICD-10-CM | POA: Diagnosis not present

## 2018-12-27 DIAGNOSIS — F419 Anxiety disorder, unspecified: Secondary | ICD-10-CM | POA: Insufficient documentation

## 2018-12-27 DIAGNOSIS — F17290 Nicotine dependence, other tobacco product, uncomplicated: Secondary | ICD-10-CM | POA: Insufficient documentation

## 2018-12-27 HISTORY — PX: IMPLANTATION OF HYPOGLOSSAL NERVE STIMULATOR: SHX6827

## 2018-12-27 SURGERY — INSERTION, HYPOGLOSSAL NERVE STIMULATOR
Anesthesia: General | Site: Neck

## 2018-12-27 MED ORDER — ACETAMINOPHEN 500 MG PO TABS
ORAL_TABLET | ORAL | Status: AC
  Administered 2018-12-27: 1000 mg via ORAL
  Filled 2018-12-27: qty 2

## 2018-12-27 MED ORDER — MIDAZOLAM HCL 5 MG/5ML IJ SOLN
INTRAMUSCULAR | Status: DC | PRN
  Administered 2018-12-27: 2 mg via INTRAVENOUS

## 2018-12-27 MED ORDER — DEXMEDETOMIDINE HCL 200 MCG/2ML IV SOLN
INTRAVENOUS | Status: DC | PRN
  Administered 2018-12-27: 8 ug via INTRAVENOUS

## 2018-12-27 MED ORDER — DEXAMETHASONE SODIUM PHOSPHATE 10 MG/ML IJ SOLN
INTRAMUSCULAR | Status: AC
  Filled 2018-12-27: qty 1

## 2018-12-27 MED ORDER — FENTANYL CITRATE (PF) 250 MCG/5ML IJ SOLN
INTRAMUSCULAR | Status: AC
  Filled 2018-12-27: qty 5

## 2018-12-27 MED ORDER — FENTANYL CITRATE (PF) 100 MCG/2ML IJ SOLN: 25.0000 ug | INTRAMUSCULAR | Status: DC | PRN

## 2018-12-27 MED ORDER — LACTATED RINGERS IV SOLN
INTRAVENOUS | Status: DC
  Administered 2018-12-27 (×2): via INTRAVENOUS

## 2018-12-27 MED ORDER — CEFAZOLIN SODIUM-DEXTROSE 2-4 GM/100ML-% IV SOLN
2.0000 g | INTRAVENOUS | Status: AC
  Administered 2018-12-27: 2 g via INTRAVENOUS

## 2018-12-27 MED ORDER — PHENYLEPHRINE 40 MCG/ML (10ML) SYRINGE FOR IV PUSH (FOR BLOOD PRESSURE SUPPORT)
PREFILLED_SYRINGE | INTRAVENOUS | Status: DC | PRN
  Administered 2018-12-27 (×2): 120 ug via INTRAVENOUS

## 2018-12-27 MED ORDER — LIDOCAINE 2% (20 MG/ML) 5 ML SYRINGE
INTRAMUSCULAR | Status: AC
  Filled 2018-12-27: qty 5

## 2018-12-27 MED ORDER — PHENYLEPHRINE HCL-NACL 10-0.9 MG/250ML-% IV SOLN
INTRAVENOUS | Status: DC | PRN
  Administered 2018-12-27: 50 ug/min via INTRAVENOUS

## 2018-12-27 MED ORDER — FENTANYL CITRATE (PF) 100 MCG/2ML IJ SOLN
INTRAMUSCULAR | Status: DC | PRN
  Administered 2018-12-27: 150 ug via INTRAVENOUS

## 2018-12-27 MED ORDER — PROPOFOL 10 MG/ML IV BOLUS
INTRAVENOUS | Status: DC | PRN
  Administered 2018-12-27: 200 mg via INTRAVENOUS

## 2018-12-27 MED ORDER — LIDOCAINE-EPINEPHRINE 1 %-1:100000 IJ SOLN
INTRAMUSCULAR | Status: AC
  Filled 2018-12-27: qty 1

## 2018-12-27 MED ORDER — SCOPOLAMINE 1 MG/3DAYS TD PT72
1.0000 | MEDICATED_PATCH | TRANSDERMAL | Status: DC
  Administered 2018-12-27: 10:00:00 1.5 mg via TRANSDERMAL

## 2018-12-27 MED ORDER — CEFAZOLIN SODIUM-DEXTROSE 2-4 GM/100ML-% IV SOLN
INTRAVENOUS | Status: AC
  Filled 2018-12-27: qty 100

## 2018-12-27 MED ORDER — MIDAZOLAM HCL 2 MG/2ML IJ SOLN
INTRAMUSCULAR | Status: AC
  Filled 2018-12-27: qty 2

## 2018-12-27 MED ORDER — EPHEDRINE 5 MG/ML INJ
INTRAVENOUS | Status: AC
  Filled 2018-12-27: qty 10

## 2018-12-27 MED ORDER — 0.9 % SODIUM CHLORIDE (POUR BTL) OPTIME
TOPICAL | Status: DC | PRN
  Administered 2018-12-27: 1000 mL

## 2018-12-27 MED ORDER — SUCCINYLCHOLINE CHLORIDE 200 MG/10ML IV SOSY
PREFILLED_SYRINGE | INTRAVENOUS | Status: AC
  Filled 2018-12-27: qty 10

## 2018-12-27 MED ORDER — STERILE WATER FOR IRRIGATION IR SOLN
Status: DC | PRN
  Administered 2018-12-27: 1000 mL

## 2018-12-27 MED ORDER — ACETAMINOPHEN 500 MG PO TABS
1000.0000 mg | ORAL_TABLET | Freq: Once | ORAL | Status: AC
  Administered 2018-12-27: 10:00:00 1000 mg via ORAL

## 2018-12-27 MED ORDER — PROPOFOL 10 MG/ML IV BOLUS
INTRAVENOUS | Status: AC
  Filled 2018-12-27: qty 40

## 2018-12-27 MED ORDER — LIDOCAINE-EPINEPHRINE 1 %-1:100000 IJ SOLN
INTRAMUSCULAR | Status: DC | PRN
  Administered 2018-12-27: 8 mL

## 2018-12-27 MED ORDER — EPHEDRINE SULFATE-NACL 50-0.9 MG/10ML-% IV SOSY
PREFILLED_SYRINGE | INTRAVENOUS | Status: DC | PRN
  Administered 2018-12-27: 5 mg via INTRAVENOUS

## 2018-12-27 MED ORDER — CELECOXIB 200 MG PO CAPS
400.0000 mg | ORAL_CAPSULE | Freq: Once | ORAL | Status: AC
  Administered 2018-12-27: 400 mg via ORAL
  Filled 2018-12-27: qty 2

## 2018-12-27 MED ORDER — DEXAMETHASONE SODIUM PHOSPHATE 10 MG/ML IJ SOLN
INTRAMUSCULAR | Status: DC | PRN
  Administered 2018-12-27: 10 mg via INTRAVENOUS

## 2018-12-27 MED ORDER — PROPOFOL 500 MG/50ML IV EMUL
INTRAVENOUS | Status: DC | PRN
  Administered 2018-12-27: 10 ug/kg/min via INTRAVENOUS

## 2018-12-27 MED ORDER — SODIUM CHLORIDE 0.9 % IV SOLN
INTRAVENOUS | Status: AC
  Filled 2018-12-27: qty 500000

## 2018-12-27 MED ORDER — PHENYLEPHRINE 40 MCG/ML (10ML) SYRINGE FOR IV PUSH (FOR BLOOD PRESSURE SUPPORT)
PREFILLED_SYRINGE | INTRAVENOUS | Status: AC
  Filled 2018-12-27: qty 10

## 2018-12-27 MED ORDER — ONDANSETRON HCL 4 MG/2ML IJ SOLN
INTRAMUSCULAR | Status: AC
  Filled 2018-12-27: qty 2

## 2018-12-27 MED ORDER — LIDOCAINE 2% (20 MG/ML) 5 ML SYRINGE
INTRAMUSCULAR | Status: DC | PRN
  Administered 2018-12-27: 100 mg via INTRAVENOUS

## 2018-12-27 MED ORDER — SCOPOLAMINE 1 MG/3DAYS TD PT72
MEDICATED_PATCH | TRANSDERMAL | Status: AC
  Administered 2018-12-27: 1.5 mg via TRANSDERMAL
  Filled 2018-12-27: qty 1

## 2018-12-27 MED ORDER — ONDANSETRON HCL 4 MG/2ML IJ SOLN
INTRAMUSCULAR | Status: DC | PRN
  Administered 2018-12-27: 4 mg via INTRAVENOUS

## 2018-12-27 MED ORDER — SODIUM CHLORIDE 0.9 % IV SOLN
INTRAVENOUS | Status: DC | PRN
  Administered 2018-12-27: 500 mL

## 2018-12-27 MED ORDER — PROMETHAZINE HCL 25 MG/ML IJ SOLN: 6.2500 mg | INTRAMUSCULAR | Status: DC | PRN

## 2018-12-27 SURGICAL SUPPLY — 73 items
BAG DECANTER FOR FLEXI CONT (MISCELLANEOUS) ×2 IMPLANT
BLADE CLIPPER SURG (BLADE) IMPLANT
BLADE SURG 15 STRL LF DISP TIS (BLADE) ×3 IMPLANT
BLADE SURG 15 STRL SS (BLADE) ×3
CANISTER SUCT 3000ML PPV (MISCELLANEOUS) ×2 IMPLANT
CORD BIPOLAR FORCEPS 12FT (ELECTRODE) ×2 IMPLANT
COVER PROBE W GEL 5X96 (DRAPES) ×2 IMPLANT
COVER SURGICAL LIGHT HANDLE (MISCELLANEOUS) ×2 IMPLANT
COVER WAND RF STERILE (DRAPES) ×2 IMPLANT
DERMABOND ADVANCED (GAUZE/BANDAGES/DRESSINGS) ×3
DERMABOND ADVANCED .7 DNX12 (GAUZE/BANDAGES/DRESSINGS) ×2 IMPLANT
DRAPE C-ARM 35X43 STRL (DRAPES) IMPLANT
DRAPE HEAD BAR (DRAPES) ×2 IMPLANT
DRAPE INCISE IOBAN 66X45 STRL (DRAPES) ×2 IMPLANT
DRAPE MICROSCOPE LEICA 54X105 (DRAPE) ×2 IMPLANT
DRAPE UTILITY XL STRL (DRAPES) IMPLANT
DRSG TEGADERM 2-3/8X2-3/4 SM (GAUZE/BANDAGES/DRESSINGS) ×4 IMPLANT
DRSG TEGADERM 4X4.75 (GAUZE/BANDAGES/DRESSINGS) ×3 IMPLANT
ELECT COATED BLADE 2.86 ST (ELECTRODE) ×2 IMPLANT
ELECT EMG 18 NIMS (NEUROSURGERY SUPPLIES) ×2
ELECT REM PT RETURN 9FT ADLT (ELECTROSURGICAL) ×2
ELECTRODE EMG 18 NIMS (NEUROSURGERY SUPPLIES) ×1 IMPLANT
ELECTRODE REM PT RTRN 9FT ADLT (ELECTROSURGICAL) ×1 IMPLANT
FORCEPS BIPOLAR SPETZLER 8 1.0 (NEUROSURGERY SUPPLIES) ×2 IMPLANT
GAUZE 4X4 16PLY RFD (DISPOSABLE) ×2 IMPLANT
GAUZE SPONGE 4X4 12PLY STRL (GAUZE/BANDAGES/DRESSINGS) ×2 IMPLANT
GAUZE SPONGE 4X4 12PLY STRL LF (GAUZE/BANDAGES/DRESSINGS) ×3 IMPLANT
GENERATOR PULSE INSPIRE (Generator) ×2 IMPLANT
GENERATOR PULSE INSPIRE IV (Generator) ×1 IMPLANT
GLOVE BIO SURGEON STRL SZ 6.5 (GLOVE) IMPLANT
GLOVE BIO SURGEON STRL SZ7.5 (GLOVE) ×3 IMPLANT
GLOVE BIOGEL PI IND STRL 7.5 (GLOVE) IMPLANT
GLOVE BIOGEL PI INDICATOR 7.5 (GLOVE) ×1
GOWN STRL REUS W/ TWL LRG LVL3 (GOWN DISPOSABLE) ×3 IMPLANT
GOWN STRL REUS W/TWL LRG LVL3 (GOWN DISPOSABLE) ×3
KIT BASIN OR (CUSTOM PROCEDURE TRAY) ×2 IMPLANT
KIT NEUROSTIMULATOR ACCESSORY (KITS) IMPLANT
KIT TURNOVER KIT B (KITS) ×2 IMPLANT
LEAD SENSING RESP INSPIRE (Lead) ×2 IMPLANT
LEAD SENSING RESP INSPIRE IV (Lead) ×1 IMPLANT
LEAD SLEEP STIM INSPIRE IV/V (Lead) ×1 IMPLANT
LEAD SLEEP STIMULATION INSPIRE (Lead) ×2 IMPLANT
LOOP VESSEL MAXI BLUE (MISCELLANEOUS) ×2 IMPLANT
LOOP VESSEL MINI RED (MISCELLANEOUS) IMPLANT
MARKER SKIN DUAL TIP RULER LAB (MISCELLANEOUS) ×4 IMPLANT
NDL HYPO 25GX1X1/2 BEV (NEEDLE) ×1 IMPLANT
NEEDLE HYPO 25GX1X1/2 BEV (NEEDLE) ×2 IMPLANT
NS IRRIG 1000ML POUR BTL (IV SOLUTION) ×2 IMPLANT
PAD ARMBOARD 7.5X6 YLW CONV (MISCELLANEOUS) ×2 IMPLANT
PASSER CATH 38CM DISP (INSTRUMENTS) ×3 IMPLANT
PENCIL BUTTON HOLSTER BLD 10FT (ELECTRODE) ×2 IMPLANT
POSITIONER HEAD DONUT 9IN (MISCELLANEOUS) ×2 IMPLANT
PROBE NERVE STIMULATOR (NEUROSURGERY SUPPLIES) ×2 IMPLANT
REMOTE CONTROL SLEEP INSPIRE (MISCELLANEOUS) ×3 IMPLANT
SET WALTER ACTIVATION W/DRAPE (SET/KITS/TRAYS/PACK) ×1 IMPLANT
SLING ARM FOAM STRAP LRG (SOFTGOODS) IMPLANT
SLING ARM FOAM STRAP MED (SOFTGOODS) IMPLANT
SLING ARM IMMOBILIZER LRG (SOFTGOODS) ×1 IMPLANT
SPONGE INTESTINAL PEANUT (DISPOSABLE) ×2 IMPLANT
STAPLER VISISTAT 35W (STAPLE) ×2 IMPLANT
SUT SILK 2 0 SH (SUTURE) ×2 IMPLANT
SUT SILK 3 0 REEL (SUTURE) ×2 IMPLANT
SUT SILK 3 0 SH 30 (SUTURE) ×4 IMPLANT
SUT SILK 3-0 (SUTURE) ×1
SUT SILK 3-0 RB1 30XBRD (SUTURE) ×1
SUT VIC AB 3-0 SH 27 (SUTURE) ×1
SUT VIC AB 3-0 SH 27X BRD (SUTURE) ×1 IMPLANT
SUT VIC AB 4-0 PS2 27 (SUTURE) ×4 IMPLANT
SUTURE SILK 3-0 RB1 30XBRD (SUTURE) ×1 IMPLANT
SYR 10ML LL (SYRINGE) ×2 IMPLANT
TAPE CLOTH SURG 4X10 WHT LF (GAUZE/BANDAGES/DRESSINGS) ×4 IMPLANT
TOWEL GREEN STERILE (TOWEL DISPOSABLE) ×2 IMPLANT
TRAY ENT MC OR (CUSTOM PROCEDURE TRAY) ×2 IMPLANT

## 2018-12-27 NOTE — Transfer of Care (Signed)
Immediate Anesthesia Transfer of Care Note  Patient: [name]  Procedure(s) Performed: IMPLANTATION OF HYPOGLOSSAL NERVE STIMULATOR (N/A Neck)  Patient Location: PACU  Anesthesia Type:General  Level of Consciousness: awake, alert  and oriented  Airway & Oxygen Therapy: Patient Spontanous Breathing and Patient connected to nasal cannula oxygen  Post-op Assessment: Report given to RN and Post -op Vital signs reviewed and stable  Post vital signs: Reviewed and stable  Last Vitals:  Vitals Value Taken Time  BP 152/82 12/27/18 1349  Temp    Pulse 104 12/27/18 1354  Resp 18 12/27/18 1354  SpO2 98 % 12/27/18 1354  Vitals shown include unvalidated device data.  Last Pain:  Vitals:   12/27/18 1005  TempSrc:   PainSc: 0-No pain      Patients Stated Pain Goal: 3 (16/38/45 3646)  Complications: No apparent anesthesia complications

## 2018-12-27 NOTE — Anesthesia Procedure Notes (Addendum)
Procedure Name: Intubation Date/Time: 12/27/2018 11:35 AM Performed by: Griffin Dakin, CRNA Pre-anesthesia Checklist: Patient identified, Emergency Drugs available, Suction available and Patient being monitored Patient Re-evaluated:Patient Re-evaluated prior to induction Oxygen Delivery Method: Circle system utilized Preoxygenation: Pre-oxygenation with 100% oxygen Induction Type: IV induction Ventilation: Mask ventilation without difficulty Laryngoscope Size: Miller and 2 Grade View: Grade II Tube type: Oral Tube size: 7.5 mm Number of attempts: 1 Airway Equipment and Method: Stylet and Oral airway Placement Confirmation: ETT inserted through vocal cords under direct vision,  positive ETCO2 and breath sounds checked- equal and bilateral Secured at: 26 cm Tube secured with: Tape Dental Injury: Teeth and Oropharynx as per pre-operative assessment  Comments: Performed by Dr. Tobias Alexander, MD

## 2018-12-27 NOTE — Anesthesia Preprocedure Evaluation (Addendum)
Anesthesia Evaluation  Patient identified by MRN, date of birth, ID band Patient awake    Reviewed: Allergy & Precautions, NPO status , Patient's Chart, lab work & pertinent test results  History of Anesthesia Complications Negative for: history of anesthetic complications  Airway Mallampati: I  TM Distance: >3 FB Neck ROM: Full    Dental  (+) Dental Advisory Given, Teeth Intact   Pulmonary sleep apnea , Current Smoker and Patient abstained from smoking.,    breath sounds clear to auscultation       Cardiovascular Exercise Tolerance: Good negative cardio ROS   Rhythm:Regular Rate:Normal     Neuro/Psych Seizures -, Well Controlled,  PSYCHIATRIC DISORDERS Anxiety  PTSD    GI/Hepatic negative GI ROS, Neg liver ROS,   Endo/Other   Obesity   Renal/GU negative Renal ROS     Musculoskeletal negative musculoskeletal ROS (+)   Abdominal   Peds  Hematology negative hematology ROS (+)   Anesthesia Other Findings   Reproductive/Obstetrics                            Anesthesia Physical  Anesthesia Plan  ASA: II  Anesthesia Plan: General   Post-op Pain Management:    Induction: Intravenous  PONV Risk Score and Plan: 3 and Treatment may vary due to age or medical condition, Ondansetron, Dexamethasone and Midazolam  Airway Management Planned: Oral ETT  Additional Equipment: None  Intra-op Plan:   Post-operative Plan: Extubation in OR  Informed Consent: I have reviewed the patients History and Physical, chart, labs and discussed the procedure including the risks, benefits and alternatives for the proposed anesthesia with the patient or authorized representative who has indicated his/her understanding and acceptance.     Dental advisory given  Plan Discussed with: CRNA, Anesthesiologist and Surgeon  Anesthesia Plan Comments:        Anesthesia Quick Evaluation
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
## 2018-12-27 NOTE — Anesthesia Postprocedure Evaluation (Signed)
Anesthesia Post Note  Patient: Electrical engineer  Procedure(s) Performed: IMPLANTATION OF HYPOGLOSSAL NERVE STIMULATOR (N/A Neck)     Patient location during evaluation: PACU Anesthesia Type: General Level of consciousness: sedated Pain management: pain level controlled Vital Signs Assessment: post-procedure vital signs reviewed and stable Respiratory status: spontaneous breathing and respiratory function stable Cardiovascular status: stable Postop Assessment: no apparent nausea or vomiting Anesthetic complications: no    Last Vitals:  Vitals:   12/27/18 1420 12/27/18 1425  BP: (!) 137/94 (!) 137/94  Pulse: 89 87  Resp: (!) 22 16  Temp:  (!) 36.1 C  SpO2: 97% 97%    Last Pain:  Vitals:   12/27/18 1350  TempSrc:   PainSc: 0-No pain                 Mikaila Grunert DANIEL

## 2018-12-27 NOTE — H&P (Signed)
[name] is an 48 y.o. male.   Chief Complaint: Obstructive sleep apnea HPI: 48 year old male with obstructive sleep apnea who has not been able to tolerate CPAP.  He presents for hypoglossal nerve stimulator implantation.  Past Medical History:  Diagnosis Date  . History of kidney stones    Due to dehydration  . Multiple allergies   . PTSD (post-traumatic stress disorder)   . Seizures ([place])    Several years ago  . Sleep apnea     Past Surgical History:  Procedure Laterality Date  . Bones reset     [employer]  . DRUG INDUCED ENDOSCOPY N/A 01/27/2018   Procedure: DRUG INDUCED ENDOSCOPY;  Surgeon: Melida Quitter, MD;  Location: [place];  Service: ENT;  Laterality: N/A;  . Removal of Shrapnel     [employer]    History reviewed. No pertinent family history. Social History:  reports that he has been smoking cigars. He has never used smokeless tobacco. He reports previous alcohol use. He reports that he does not use drugs.  Allergies:  Allergies  Allergen Reactions  . Pork-Derived Products Anaphylaxis  . Dilantin [Phenytoin] Other (See Comments)    Causes seizure     Medications Prior to Admission  Medication Sig Dispense Refill  . cetirizine (ZYRTEC) 10 MG tablet Take 10 mg by mouth daily.    . fexofenadine-pseudoephedrine (ALLEGRA-D 24) 180-240 MG 24 hr tablet Take 1 tablet by mouth at bedtime.    . fluticasone (FLONASE) 50 MCG/ACT nasal spray Place 1 spray into both nostrils as needed for allergies or rhinitis.      No results found for this or any previous visit (from the past 48 hour(s)). No results found.  Review of Systems  All other systems reviewed and are negative.   Blood pressure (!) 147/90, pulse 90, temperature (!) 97.5 F (36.4 C), temperature source Oral, resp. rate 18, height 6\' 1"  (1.854 m), weight 109.8 kg, SpO2 99 %. Physical Exam  Constitutional: He is oriented to person, place, and time. He appears well-developed and  well-nourished. No distress.  HENT:  Head: Normocephalic and atraumatic.  Right Ear: External ear normal.  Left Ear: External ear normal.  Nose: Nose normal.  Mouth/Throat: Oropharynx is clear and moist.  Eyes: Pupils are equal, round, and reactive to light. Conjunctivae and EOM are normal.  Neck: Normal range of motion. Neck supple.  Cardiovascular: Normal rate.  Respiratory: Effort normal.  Musculoskeletal: Normal range of motion.  Neurological: He is alert and oriented to person, place, and time. No cranial nerve deficit.  Skin: Skin is warm and dry.  Psychiatric: He has a normal mood and affect. His behavior is normal. Judgment and thought content normal.     Assessment/Plan Obstructive sleep apnea  To OR for hypoglossal nerve stimulator placement.  Melida Quitter, MD 12/27/2018, 11:13 AM

## 2018-12-27 NOTE — Op Note (Signed)
[name], [name] ACCOUNT [id number] DATE OF BIRTH:03/10/1970 FACILITY: [facility] LOCATION: [facility] PHYSICIAN:Breawna Montenegro DJenne Pane, MD  OPERATIVE REPORT  DATE OF PROCEDURE:  12/27/2018  PREOPERATIVE DIAGNOSIS:  Obstructive sleep apnea.  POSTOPERATIVE DIAGNOSIS:  Obstructive sleep apnea.  PROCEDURE:  Placement of hypoglossal nerve stimulator with intraoperative testing and placement of sensor lead.    SURGEON:  Christia Reading, MD   ASSISTANT:  Aquilla Hacker, PA, who was necessary to assist in retraction.  ANESTHESIA:  General endotracheal anesthesia.  COMPLICATIONS:  None.  INDICATIONS:  The patient is a 48 year old male with obstructive sleep apnea who has been unable to tolerate CPAP.  He presents for placement of the hypoglossal nerve stimulator.  FINDINGS:  Surgical anatomy was normal.  The device was tested intraoperatively with excellent results.  DESCRIPTION OF PROCEDURE:  The patient was identified in the holding room, informed consent having been obtained, including discussion of risks, benefits and alternatives, the patient was brought to the operative suite and put on the operative table in  supine position.  Anesthesia was induced and the patient was intubated by the anesthesia team without difficulty.  The patient was given intravenous antibiotics during the case.  The eyes were taped closed and the bed was turned 180 degrees from  anesthesia.  The 3 incisions were marked with a marking pen and measured out and injected with 1% lidocaine with 1:100,000 epinephrine.  The nerve integrity monitor was placed in the right tongue and floor of mouth and turned on during the case.  The  right neck and chest were prepped and draped in sterile fashion.  The neck incision was made with a 15 blade scalpel and extended through subcutaneous tissue to the platysmal layer using Bovie electrocautery.  The platysmal layer was then elevated with a  clamp and  divided with Bovie electrocautery.  Dissection was then performed directly down to the digastric tendon and exposing the submandibular gland.  The tendon was then retracted inferiorly with vessel loops.  The mylohyoid muscle was isolated and  retracted anteriorly, exposing the hypoglossal nerve.  Fascia was then divided off of the hypoglossal nerve under the operating microscope.  The various branches of the nerve were then identified using the nerve stimulator and the nerve was elevated.   The inclusion branches were isolated and the stimulator cuff was then placed around the inclusion branches.  The anchor was then sutured to the digastric tendon using 3-0 silk suture in 2 locations.  The lead was then fully placed in the wound and a damp  gauze was placed.  The upper chest incision was then made with a 15 blade scalpel and extended through subcutaneous tissues using Bovie electrocautery down to the pectoralis fascia.  Soft tissues were then elevated off the pectoralis fascia in an  inferior direction creating a pocket for the generator.  A 2-0 silk suture was then placed at the superior extent of the incision in 2 locations as stay sutures.  The lateral chest incision was then made with a 15 blade scalpel and extended through the  subcutaneous tissues using Bovie electrocautery.  The serratus muscle was identified overlying one of the intercostal spaces and was then bluntly dissected and retracted, exposing the external intercostal muscle.  This was then dissected bluntly down to  the level of the internal intercostal muscle.  The malleable was then placed between these 2 muscles running anteriorly and the sensor lead was then placed into the space properly, removing the malleable.  The  distal anchor was then secured using 3-0  silk suture in 3 locations.  The second anchor was then secured with 3-0 silk suture in 2 locations.  At this point, the tunneler was placed from the upper chest incision down to  the lateral chest incision and was then used to bring the sensor lead up  into the generator pocket.  In the neck, a blunt dissection was performed onto the platysmal muscle toward the clavicle and the tunneler was then placed down through this tunnel, over the clavicle and into the upper chest incision.  The stimulating lead  was then pulled down into the generator pocket as well.  The generator was then opened and each lead was cleaned off and placed into the generator and tightened down with the screwdriver to 2 clicks.  Each lead was tested for positioning and with a tug  and found to be in good position.  The generator was then placed into the pocket and the device was then tested.  Results were excellent.  At this point, the generator was turned back off.  Each of the incision was copiously irrigated with saline.  The  platysmal layer was closed with 3-0 Vicryl suture in a simple interrupted fashion, as was the deep tissue in each of the other 2 incision sites.  The subcutaneous layer was then closed in all 3 sites using 4-0 Vicryl suture in a simple interrupted  fashion.  Skin was cleaned off in all 3 sites and then each incision was covered with Dermabond.  The patient was cleaned off fully and the drapes removed.  A pressure dressing was placed over each of the 3 incision sites.  He was returned to anesthesia  for wakeup and was extubated and taken to the recovery room in stable condition.  VN/NUANCE  D:12/27/2018 T:12/27/2018 JOB:[id number]

## 2018-12-27 NOTE — Brief Op Note (Signed)
12/27/2018  1:26 PM  PATIENT:  [name]  48 y.o. male  PRE-OPERATIVE DIAGNOSIS:  G47.33 OSA (Obstructive sleep apnea)  POST-OPERATIVE DIAGNOSIS:  G47.33 OSA (Obstructive sleep apnea)  PROCEDURE:  Procedure(s): IMPLANTATION OF HYPOGLOSSAL NERVE STIMULATOR (N/A)  SURGEON:  Surgeon(s) and Role:    Melida Quitter, MD - Primary  PHYSICIAN ASSISTANT: Sallee Provencal  ASSISTANTS: none   ANESTHESIA:   general  EBL:  Minimal  BLOOD ADMINISTERED:none  DRAINS: none   LOCAL MEDICATIONS USED:  LIDOCAINE   SPECIMEN:  No Specimen  DISPOSITION OF SPECIMEN:  N/A  COUNTS:  YES  TOURNIQUET:  * No tourniquets in log *  DICTATION: .Other Dictation: Dictation Number [id number]  PLAN OF CARE: Discharge to home after PACU  PATIENT DISPOSITION:  PACU - hemodynamically stable.   Delay start of Pharmacological VTE agent (>24hrs) due to surgical blood loss or risk of bleeding: no

## 2018-12-28 ENCOUNTER — Encounter (HOSPITAL_COMMUNITY): Payer: Self-pay | Admitting: Otolaryngology

## 2019-01-02 DIAGNOSIS — F4323 Adjustment disorder with mixed anxiety and depressed mood: Secondary | ICD-10-CM | POA: Diagnosis not present

## 2019-01-09 DIAGNOSIS — F4323 Adjustment disorder with mixed anxiety and depressed mood: Secondary | ICD-10-CM | POA: Diagnosis not present

## 2019-01-16 DIAGNOSIS — F4323 Adjustment disorder with mixed anxiety and depressed mood: Secondary | ICD-10-CM | POA: Diagnosis not present

## 2019-01-23 DIAGNOSIS — F4323 Adjustment disorder with mixed anxiety and depressed mood: Secondary | ICD-10-CM | POA: Diagnosis not present

## 2019-01-30 DIAGNOSIS — F4323 Adjustment disorder with mixed anxiety and depressed mood: Secondary | ICD-10-CM | POA: Diagnosis not present

## 2019-02-06 ENCOUNTER — Encounter: Payer: Self-pay | Admitting: Cardiology

## 2019-02-06 ENCOUNTER — Other Ambulatory Visit: Payer: Self-pay

## 2019-02-06 ENCOUNTER — Ambulatory Visit: Payer: Federal, State, Local not specified - PPO | Admitting: Cardiology

## 2019-02-06 VITALS — BP 134/86 | HR 109 | Ht 73.0 in | Wt 244.8 lb

## 2019-02-06 DIAGNOSIS — G4733 Obstructive sleep apnea (adult) (pediatric): Secondary | ICD-10-CM

## 2019-02-06 DIAGNOSIS — E669 Obesity, unspecified: Secondary | ICD-10-CM | POA: Diagnosis not present

## 2019-02-06 DIAGNOSIS — F4323 Adjustment disorder with mixed anxiety and depressed mood: Secondary | ICD-10-CM | POA: Diagnosis not present

## 2019-02-06 NOTE — Progress Notes (Signed)
Cardiology Office Note:    Date:  02/06/2019   ID:  [name], DOB March 26, 1970, MRN [id number]  PCP:  Quintella Reichert, MD  Cardiologist:  No primary care provider on file.    Referring MD: Renaye Rakers, MD   Chief Complaint  Patient presents with  . Sleep Apnea  . Hypertension    History of Present Illness:    [name] is a 48 y.o. male with a hx of PTSD and seizure disorder who was referred to Dr. Jenne Pane with ENT for evaluation of OSA.  He was apparently diagnosed with OSA in 2015 and says his AHI was high.  He tried oral appliances and CPAP but had difficulty tolerating these due to prior PTSD from his [employer].  He has had severe excessive daytime sleepiness and very poor sleep at night only sleeping 4 hours if he is lucky.  He has severe snoring and has to sleep in another room.  He drinks coffee throughout the day to stay awake. He also has severe allergies which make it very difficult for him to breath at night with the mask on despite taking meds for allergies. He also had significant allergies that contributed as well.    Due to sx of excessive daytime sleepiness, he was referred to Dr. Jenne Pane for evaluation of the [facility] device.  He underwent repeat home sleep study showing severe OSA with an AHI of 45.1/hr with no central apnea.  His Epworth sleepiness score was 12.  Oxygen saturations dropped as low as 65%.  He underwent drug induced sleep endoscopy on 01/27/2018 and was felt to be a good candidate for hypoglossal nerve stimulator. He underwent hypoglossal nerve stimulator implant on 12/27/2018 and is now here in the office for activation of his device.    He is doing well post op.  His incisions are well healed.  He is anxious to get started on his device.    Past Medical History:  Diagnosis Date  . History of kidney stones    Due to dehydration  . Multiple allergies   . PTSD (post-traumatic stress disorder)   . Seizures ([facility])    Several years ago  .  Sleep apnea     Past Surgical History:  Procedure Laterality Date  . Bones reset     [employer]  . DRUG INDUCED ENDOSCOPY N/A 01/27/2018   Procedure: DRUG INDUCED ENDOSCOPY;  Surgeon: Christia Reading, MD;  Location: [facility];  Service: ENT;  Laterality: N/A;  . IMPLANTATION OF HYPOGLOSSAL NERVE STIMULATOR N/A 12/27/2018   Procedure: IMPLANTATION OF HYPOGLOSSAL NERVE STIMULATOR;  Surgeon: Christia Reading, MD;  Location: [facility] OR;  Service: ENT;  Laterality: N/A;  . Removal of Shrapnel     [employer]    Current Medications: Current Meds  Medication Sig  . cetirizine (ZYRTEC) 10 MG tablet Take 10 mg by mouth daily.  . fexofenadine-pseudoephedrine (ALLEGRA-D 24) 180-240 MG 24 hr tablet Take 1 tablet by mouth at bedtime.  . fluticasone (FLONASE) 50 MCG/ACT nasal spray Place 1 spray into both nostrils as needed for allergies or rhinitis.     Allergies:   Other, Pork-derived products, and Dilantin [phenytoin]   Social History   Socioeconomic History  . Marital status: Married    Spouse name: Not on file  . Number of children: Not on file  . Years of education: Not on file  . Highest education level: Not on file  Occupational History  . Not on file  Tobacco Use  .  Smoking status: Current Some Day Smoker    Types: Cigars  . Smokeless tobacco: Never Used  . Tobacco comment: one cigar a week  Substance and Sexual Activity  . Alcohol use: Not Currently  . Drug use: No  . Sexual activity: Yes  Other Topics Concern  . Not on file  Social History Narrative  . Not on file   Social Determinants of Health   Financial Resource Strain:   . Difficulty of Paying Living Expenses: Not on file  Food Insecurity:   . Worried About Charity fundraiser in the Last Year: Not on file  . Ran Out of Food in the Last Year: Not on file  Transportation Needs:   . Lack of Transportation (Medical): Not on file  . Lack of Transportation (Non-Medical): Not on file  Physical Activity:   . Days  of Exercise per Week: Not on file  . Minutes of Exercise per Session: Not on file  Stress:   . Feeling of Stress : Not on file  Social Connections:   . Frequency of Communication with Friends and Family: Not on file  . Frequency of Social Gatherings with Friends and Family: Not on file  . Attends Religious Services: Not on file  . Active Member of Clubs or Organizations: Not on file  . Attends Archivist Meetings: Not on file  . Marital Status: Not on file     Family History: The patient's family history is not on file.  ROS:   Please see the history of present illness.    ROS  All other systems reviewed and negative.   EKGs/Labs/Other Studies Reviewed:    The following studies were reviewed today: Op note for Inspire Implant  EKG:  EKG is not ordered today  Recent Labs: 12/21/2018: BUN 7; Creatinine, Ser 0.92; Hemoglobin 14.7; Platelets 229; Potassium 4.0; Sodium 135   Recent Lipid Panel No results found for: CHOL, TRIG, HDL, CHOLHDL, VLDL, LDLCALC, LDLDIRECT  Physical Exam:    VS:  BP 134/86   Pulse (!) 109   Ht 6\' 1"  (1.854 m)   Wt 244 lb 12.8 oz (111 kg)   SpO2 95%   BMI 32.30 kg/m     Wt Readings from Last 3 Encounters:  02/06/19 244 lb 12.8 oz (111 kg)  12/27/18 242 lb (109.8 kg)  12/21/18 242 lb (109.8 kg)     GEN:  Well nourished, well developed in no acute distress HEENT: Normal NECK: No JVD; No carotid bruits LYMPHATICS: No lymphadenopathy CARDIAC: RRR, no murmurs, rubs, gallops RESPIRATORY:  Clear to auscultation without rales, wheezing or rhonchi  ABDOMEN: Soft, non-tender, non-distended MUSCULOSKELETAL:  No edema; No deformity  SKIN: Warm and dry NEUROLOGIC:  Alert and oriented x 3 PSYCHIATRIC:  Normal affect   ASSESSMENT:    1. OSA (obstructive sleep apnea)   2. Obesity (BMI 30-39.9)    PLAN:    In order of problems listed above:  1.  OSA -intolerant to CPAP therapy due to severe claustrophobia and PTSD -s/p Inspire  device -The device was interrogated with the inspire Rep today and programmed with the following data: Sensation: 1.0V Functional 1.4V Range set at 1.2-2.2V Waveform excellent Therapy duration set for 8 hours nightly Pause Time 15 minutes Start Delay 30 minutes -he will have a 4 week virtual followup with me to see how he is doing and then set up a sleep study for fine tuning in 3 months.  2.  Obesity -I have encouraged  himi to get into a routine exercise program and cut back on carbs and portions.   I have spent a total of 35 minutes with patient reviewing Inspire device settings, interrogation of device and programming and examining patient as well as establishing an assessment and plan that was discussed with the patient.  > 50% of time was spent in direct patient care.     Medication Adjustments/Labs and Tests Ordered: Current medicines are reviewed at length with the patient today.  Concerns regarding medicines are outlined above.  No orders of the defined types were placed in this encounter.  No orders of the defined types were placed in this encounter.   Signed, Armanda Magicraci Janai Maudlin, MD  02/06/2019 3:50 PM    [facility]

## 2019-02-13 DIAGNOSIS — F4323 Adjustment disorder with mixed anxiety and depressed mood: Secondary | ICD-10-CM | POA: Diagnosis not present

## 2019-02-27 DIAGNOSIS — F4323 Adjustment disorder with mixed anxiety and depressed mood: Secondary | ICD-10-CM | POA: Diagnosis not present

## 2019-03-06 DIAGNOSIS — F4323 Adjustment disorder with mixed anxiety and depressed mood: Secondary | ICD-10-CM | POA: Diagnosis not present

## 2019-03-13 DIAGNOSIS — F4323 Adjustment disorder with mixed anxiety and depressed mood: Secondary | ICD-10-CM | POA: Diagnosis not present

## 2019-03-20 DIAGNOSIS — F4323 Adjustment disorder with mixed anxiety and depressed mood: Secondary | ICD-10-CM | POA: Diagnosis not present

## 2019-03-27 DIAGNOSIS — F4323 Adjustment disorder with mixed anxiety and depressed mood: Secondary | ICD-10-CM | POA: Diagnosis not present

## 2019-03-29 ENCOUNTER — Encounter: Payer: Self-pay | Admitting: Cardiology

## 2019-03-29 ENCOUNTER — Ambulatory Visit: Payer: Federal, State, Local not specified - PPO | Admitting: Cardiology

## 2019-03-29 ENCOUNTER — Other Ambulatory Visit: Payer: Self-pay

## 2019-03-29 VITALS — BP 132/88 | HR 103 | Ht 73.0 in | Wt 249.0 lb

## 2019-03-29 DIAGNOSIS — G4733 Obstructive sleep apnea (adult) (pediatric): Secondary | ICD-10-CM

## 2019-03-29 DIAGNOSIS — E669 Obesity, unspecified: Secondary | ICD-10-CM

## 2019-04-02 ENCOUNTER — Telehealth: Payer: Self-pay | Admitting: *Deleted

## 2019-04-02 DIAGNOSIS — G4733 Obstructive sleep apnea (adult) (pediatric): Secondary | ICD-10-CM

## 2019-04-02 NOTE — Telephone Encounter (Addendum)
Patient has a titration appointment scheduled for 05/14/19. Patient was grateful for the call and thanked me.

## 2019-04-02 NOTE — Telephone Encounter (Signed)
Fax PA request for CPAP titration to [facility] [phone].

## 2019-04-03 DIAGNOSIS — F4323 Adjustment disorder with mixed anxiety and depressed mood: Secondary | ICD-10-CM | POA: Diagnosis not present

## 2019-04-03 NOTE — Progress Notes (Signed)
Cardiology Office Note:    Date:  04/03/2019   ID:  [name], DOB [birth year], MRN [id number]  PCP:  Renaye Rakers, MD  Cardiologist:  No primary care provider on file.    Referring MD: Quintella Reichert, MD   No chief complaint on file.   History of Present Illness:    [name] is a 49 y.o. male with a hx of  PTSD and seizure disorder who was referred to Dr. Jenne Pane with ENT for evaluation of OSA. He was apparently diagnosed with OSA in 2015 and says his AHI was high. He tried oral appliances and CPAP but had difficulty tolerating these due to prior PTSD from his [employer]. He has had severe excessive daytime sleepiness and very poor sleep at night only sleeping 4 hours if he is lucky. He has severe snoring and has to sleep in another room. He drinks coffee throughout the day to stay awake. He also has severe allergies which make it very difficult for him to breath at night with the mask on despite taking meds for allergies.He also had significant allergies that contributed as well.   Due to sx of excessive daytime sleepiness, he was referred to Dr. Jenne Pane for evaluation of the [facility] device. He underwent repeat home sleep study showing severe OSA with an AHI of 45.1/hr with no central apnea. His Epworth sleepiness score was 12. Oxygen saturations dropped as low as 65%. He underwent drug induced sleep endoscopy on 01/27/2018 and was felt to be a good candidate for hypoglossal nerve stimulator. He underwent hypoglossal nerve stimulator implant on 12/27/2018 and was seen back in the office 02/06/2019 for activation of his device.  He called our office recently because he had initially noticed an improvement in is daytime sleepiness but recently that seems to have tapered off. He has rapidly acclimated and has already increased his level on the functional range set of 1.2-2.2V.  He is not here today for evaluation with the Inspire rep to adjust his settings.     Past  Medical History:  Diagnosis Date  . History of kidney stones    Due to dehydration  . Multiple allergies   . PTSD (post-traumatic stress disorder)   . Seizures ([facility])    Several years ago  . Sleep apnea     Past Surgical History:  Procedure Laterality Date  . Bones reset     [employer]  . DRUG INDUCED ENDOSCOPY N/A 01/27/2018   Procedure: DRUG INDUCED ENDOSCOPY;  Surgeon: Christia Reading, MD;  Location: [facility];  Service: ENT;  Laterality: N/A;  . IMPLANTATION OF HYPOGLOSSAL NERVE STIMULATOR N/A 12/27/2018   Procedure: IMPLANTATION OF HYPOGLOSSAL NERVE STIMULATOR;  Surgeon: Christia Reading, MD;  Location: [facility] OR;  Service: ENT;  Laterality: N/A;  . Removal of Shrapnel     [employer]    Current Medications: Current Meds  Medication Sig  . cetirizine (ZYRTEC) 10 MG tablet Take 10 mg by mouth daily.  . fexofenadine-pseudoephedrine (ALLEGRA-D 24) 180-240 MG 24 hr tablet Take 1 tablet by mouth at bedtime.  . fluticasone (FLONASE) 50 MCG/ACT nasal spray Place 1 spray into both nostrils as needed for allergies or rhinitis.     Allergies:   Other, Pork-derived products, and Dilantin [phenytoin]   Social History   Socioeconomic History  . Marital status: Married    Spouse name: Not on file  . Number of children: Not on file  . Years of education: Not on file  . Highest education  level: Not on file  Occupational History  . Not on file  Tobacco Use  . Smoking status: Current Some Day Smoker    Types: Cigars  . Smokeless tobacco: Never Used  . Tobacco comment: one cigar a week  Substance and Sexual Activity  . Alcohol use: Not Currently  . Drug use: No  . Sexual activity: Yes  Other Topics Concern  . Not on file  Social History Narrative  . Not on file   Social Determinants of Health   Financial Resource Strain:   . Difficulty of Paying Living Expenses: Not on file  Food Insecurity:   . Worried About Charity fundraiser in the Last Year: Not on file  . Ran Out  of Food in the Last Year: Not on file  Transportation Needs:   . Lack of Transportation (Medical): Not on file  . Lack of Transportation (Non-Medical): Not on file  Physical Activity:   . Days of Exercise per Week: Not on file  . Minutes of Exercise per Session: Not on file  Stress:   . Feeling of Stress : Not on file  Social Connections:   . Frequency of Communication with Friends and Family: Not on file  . Frequency of Social Gatherings with Friends and Family: Not on file  . Attends Religious Services: Not on file  . Active Member of Clubs or Organizations: Not on file  . Attends Archivist Meetings: Not on file  . Marital Status: Not on file     Family History: The patient's family history is not on file.  ROS:   Please see the history of present illness.    ROS  All other systems reviewed and negative.   EKGs/Labs/Other Studies Reviewed:    The following studies were reviewed today: Interrogation was performed in the office today with Inspire rep  EKG:  EKG is not ordered today.    Recent Labs: 12/21/2018: BUN 7; Creatinine, Ser 0.92; Hemoglobin 14.7; Platelets 229; Potassium 4.0; Sodium 135   Recent Lipid Panel No results found for: CHOL, TRIG, HDL, CHOLHDL, VLDL, LDLCALC, LDLDIRECT  Physical Exam:    VS:  BP 132/88   Pulse (!) 103   Ht 6\' 1"  (1.854 m)   Wt 249 lb (112.9 kg)   SpO2 97%   BMI 32.85 kg/m     Wt Readings from Last 3 Encounters:  03/29/19 249 lb (112.9 kg)  02/06/19 244 lb 12.8 oz (111 kg)  12/27/18 242 lb (109.8 kg)     GEN:  Well nourished, well developed in no acute distress HEENT: Normal.  Normal response to hypoglossal nerve stimulation RESPIRATORY:  No audible wheezing MUSCULOSKELETAL:  No edema; No deformity  SKIN: Warm and dry NEUROLOGIC:  Alert and oriented x 3 PSYCHIATRIC:  Normal affect   ASSESSMENT:    1. OSA (obstructive sleep apnea)   2. Obesity (BMI 30-39.9)    PLAN:    In order of problems listed above:   1.  OSA -intolerant to CPAP therapy due to severe claustrophobia and PTSD -s/p Inspire device -The device was interrogated with the inspire Rep today and programmed with the following data: Functional Range set at 2.2-3.2V. -he was instructed to call if he is not feeling any benefit -he will be set up in March for sleep study to fine tune device  2.  Obesity -I have encouraged him to get into a routine exercise program and cut back on carbs and portions.    Medication  Adjustments/Labs and Tests Ordered: Current medicines are reviewed at length with the patient today.  Concerns regarding medicines are outlined above.  No orders of the defined types were placed in this encounter.  No orders of the defined types were placed in this encounter.   Signed, Armanda Magic, MD  04/03/2019 8:36 PM    [facility]

## 2019-04-09 ENCOUNTER — Telehealth: Payer: Self-pay | Admitting: *Deleted

## 2019-04-09 NOTE — Telephone Encounter (Signed)
-----   Message from Reesa Chew, CMA sent at 04/02/2019  1:55 PM EST ----- Regarding: precert (inspire device) Cpap titration

## 2019-04-09 NOTE — Telephone Encounter (Signed)
Staff message sent to [place] [place] ok to schedule CPAP titration. [employer] auth received. Auth # [id number]. Valid dates 04/06/19 to 10/03/19.

## 2019-04-10 DIAGNOSIS — F4323 Adjustment disorder with mixed anxiety and depressed mood: Secondary | ICD-10-CM | POA: Diagnosis not present

## 2019-04-16 ENCOUNTER — Telehealth: Payer: Self-pay | Admitting: *Deleted

## 2019-04-16 NOTE — Telephone Encounter (Signed)
Patient is scheduled for CPAP Titration on 05/14/19. PT is scheduled for COVID screening on 05/12/19 11:15 prior to titration.  Patient understands his titration study will be done at [facility] sleep lab. Patient understands he will receive a letter in a week or so detailing appointment, date, time, and location. Patient understands to call if he does not receive the letter  in a timely manner. Patient agrees with treatment and thanked me for call.

## 2019-04-16 NOTE — Telephone Encounter (Signed)
-----   Message from Gaynelle Cage, CMA sent at 04/09/2019  8:26 AM EST ----- Regarding: RE: precert (inspire device) BCBS Auth received  ok to schedule titration study. Auth # [id number]. Valid dates 04/06/19 to 10/03/19. ----- Message ----- From: Reesa Chew, CMA Sent: 04/02/2019   1:55 PM EST To: Cv Div Sleep Studies Subject: precert (inspire device)                       Cpap titration

## 2019-04-17 DIAGNOSIS — F4323 Adjustment disorder with mixed anxiety and depressed mood: Secondary | ICD-10-CM | POA: Diagnosis not present

## 2019-04-24 DIAGNOSIS — F4323 Adjustment disorder with mixed anxiety and depressed mood: Secondary | ICD-10-CM | POA: Diagnosis not present

## 2019-04-29 ENCOUNTER — Ambulatory Visit: Payer: Federal, State, Local not specified - PPO | Attending: Internal Medicine

## 2019-04-29 DIAGNOSIS — Z23 Encounter for immunization: Secondary | ICD-10-CM

## 2019-04-29 NOTE — Progress Notes (Signed)
   Covid-19 Vaccination Clinic  Name:  [name]    MRN: [id number] DOB: Feb 14, 1971  04/29/2019  [name] was observed post Covid-19 immunization for 15 minutes without incident. He was provided with Vaccine Information Sheet and instruction to access the V-Safe system.   [name] was instructed to call 911 with any severe reactions post vaccine: Marland Kitchen Difficulty breathing  . Swelling of face and throat  . A fast heartbeat  . A bad rash all over body  . Dizziness and weakness   Immunizations Administered    Name Date Dose VIS Date Route   Pfizer COVID-19 Vaccine 04/29/2019 10:24 AM 0.3 mL 02/02/2019 Intramuscular   Manufacturer: [employer], [employer]   Lot: [id number]   NDC: [address]-8

## 2019-05-01 DIAGNOSIS — F4323 Adjustment disorder with mixed anxiety and depressed mood: Secondary | ICD-10-CM | POA: Diagnosis not present

## 2019-05-08 DIAGNOSIS — F4323 Adjustment disorder with mixed anxiety and depressed mood: Secondary | ICD-10-CM | POA: Diagnosis not present

## 2019-05-12 ENCOUNTER — Other Ambulatory Visit (HOSPITAL_COMMUNITY)
Admission: RE | Admit: 2019-05-12 | Discharge: 2019-05-12 | Disposition: A | Discharge status: Home / Self Care | Payer: Federal, State, Local not specified - PPO | Source: Ambulatory Visit | Attending: Cardiology | Admitting: Cardiology

## 2019-05-12 DIAGNOSIS — Z01812 Encounter for preprocedural laboratory examination: Secondary | ICD-10-CM | POA: Insufficient documentation

## 2019-05-12 DIAGNOSIS — Z20822 Contact with and (suspected) exposure to covid-19: Secondary | ICD-10-CM | POA: Insufficient documentation

## 2019-05-12 LAB — SARS CORONAVIRUS 2 (TAT 6-24 HRS): SARS Coronavirus 2: NEGATIVE

## 2019-05-14 ENCOUNTER — Ambulatory Visit (HOSPITAL_BASED_OUTPATIENT_CLINIC_OR_DEPARTMENT_OTHER): Payer: Federal, State, Local not specified - PPO | Attending: Cardiology | Admitting: Cardiology

## 2019-05-14 ENCOUNTER — Other Ambulatory Visit: Payer: Self-pay

## 2019-05-14 DIAGNOSIS — G4733 Obstructive sleep apnea (adult) (pediatric): Secondary | ICD-10-CM | POA: Insufficient documentation

## 2019-05-15 ENCOUNTER — Telehealth: Payer: Self-pay | Admitting: *Deleted

## 2019-05-15 DIAGNOSIS — F4323 Adjustment disorder with mixed anxiety and depressed mood: Secondary | ICD-10-CM | POA: Diagnosis not present

## 2019-05-15 NOTE — Procedures (Signed)
    Patient Name: [name], [name] Date: 05/14/2019 Gender: Male D.O.B: 03-05-1970 Age (years): 49 Referring Provider: Armanda Magic MD, ABSM Height (inches): 73 Interpreting Physician: Armanda Magic MD, ABSM Weight (lbs): 240 RPSGT: Ulyess Mort BMI: 32 MRN: [id number] Neck Size: 16.50  CLINICAL INFORMATION The patient is referred for a CPAP titration to treat sleep apnea.  SLEEP STUDY TECHNIQUE As per the AASM Manual for the Scoring of Sleep and Associated Events v2.3 (April 2016) with a hypopnea requiring 4% desaturations.  The channels recorded and monitored were frontal, central and occipital EEG, electrooculogram (EOG), submentalis EMG (chin), nasal and oral airflow, thoracic and abdominal wall motion, anterior tibialis EMG, snore microphone, electrocardiogram, and pulse oximetry. Continuous positive airway pressure (CPAP) was initiated at the beginning of the study and titrated to treat sleep-disordered breathing.  MEDICATIONS Medications self-administered by patient taken the night of the study : N/A  TECHNICIAN COMMENTS Comments added by technician: PATIENT WAS ORDERED AS A INSPIRE  TITRATION. Comments added by scorer: N/A  RESPIRATORY PARAMETERS Optimal PAP Pressure (cm): N/A AHI at Optimal Pressure (/hr):65.9 Overall Minimal O2 (%):76.0  Supine % at Optimal Pressure (%):N/A Minimal O2 at Optimal Pressure (%):N/A   SLEEP ARCHITECTURE The study was initiated at 10:48:48 PM and ended at 4:48:11 AM.  Sleep onset time was 7.5 minutes and the sleep efficiency was 72.9%. The total sleep time was 261.9 minutes.  The patient spent 25.5% of the night in stage N1 sleep, 65.5% in stage N2 sleep, 0.0% in stage N3 and 9% in REM.Stage REM latency was 130.0 minutes  Wake after sleep onset was 90.0. Alpha intrusion was absent. Supine sleep was 75.98%.  CARDIAC DATA The 2 lead EKG demonstrated sinus rhythm. The mean heart rate was 81.4 beats per minute. Other EKG  findings include: None.  LEG MOVEMENT DATA The total Periodic Limb Movements of Sleep (PLMS) were 0. The PLMS index was 0.0. A PLMS index of <15 is considered normal in adults.  IMPRESSIONS - An optimal PAP pressure could not be selected for this patient based on the available study data. - Central sleep apnea was not noted during this titration (CAI = 0.0/h). - Severe oxygen desaturations were observed during this titration (min O2 = 76.0%). - The patient snored with loud snoring volume during this titration study. - No cardiac abnormalities were observed during this study. - Clinically significant periodic limb movements were not noted during this study. Arousals associated with PLMs were rare.  DIAGNOSIS - Obstructive Sleep Apnea (327.23 [G47.33 ICD-10])  RECOMMENDATIONS - Recommend repeat in lab study with BiPAP.  - Avoid alcohol, sedatives and other CNS depressants that may worsen sleep apnea and disrupt normal sleep architecture. - Sleep hygiene should be reviewed to assess factors that may improve sleep quality. - Weight management and regular exercise should be initiated or continued.  [Electronically signed] 05/15/2019 03:04 PM  Armanda Magic MD, ABSM Diplomate, American Board of Sleep Medicine   NPI: [id number]

## 2019-05-15 NOTE — Telephone Encounter (Signed)
Informed patient of sleep study results and patient understanding was verbalized. Patient understands his sleep study showed they had a unsuccessful PAP titration due to severity of OSA and set up for in lab BiPAP titration. Pt is aware and agreeable to his results.  Bipap titration sent to sleep pool.

## 2019-05-15 NOTE — Telephone Encounter (Signed)
-----   Message from Quintella Reichert, MD sent at 05/15/2019  3:09 PM EDT ----- Please let patient know that they had a unsuccessful PAP titration due to severity of OSA and set up for in lab BiPAP titration

## 2019-05-22 DIAGNOSIS — F4323 Adjustment disorder with mixed anxiety and depressed mood: Secondary | ICD-10-CM | POA: Diagnosis not present

## 2019-05-29 ENCOUNTER — Ambulatory Visit: Payer: Federal, State, Local not specified - PPO | Attending: Internal Medicine

## 2019-05-29 DIAGNOSIS — Z23 Encounter for immunization: Secondary | ICD-10-CM

## 2019-05-29 DIAGNOSIS — F4323 Adjustment disorder with mixed anxiety and depressed mood: Secondary | ICD-10-CM | POA: Diagnosis not present

## 2019-05-29 NOTE — Progress Notes (Signed)
   Covid-19 Vaccination Clinic  Name:  [name]    MRN: [id number] DOB: [birth year]  05/29/2019  [name] was observed post Covid-19 immunization for 15 minutes without incident. He was provided with Vaccine Information Sheet and instruction to access the V-Safe system.   [name] was instructed to call 911 with any severe reactions post vaccine: Marland Kitchen Difficulty breathing  . Swelling of face and throat  . A fast heartbeat  . A bad rash all over body  . Dizziness and weakness   Immunizations Administered    Name Date Dose VIS Date Route   Pfizer COVID-19 Vaccine 05/29/2019 10:59 AM 0.3 mL 02/02/2019 Intramuscular   Manufacturer: [employer], [employer]   Lot: [id number]   NDC: [address]-4

## 2019-06-05 DIAGNOSIS — F4323 Adjustment disorder with mixed anxiety and depressed mood: Secondary | ICD-10-CM | POA: Diagnosis not present

## 2019-06-12 ENCOUNTER — Other Ambulatory Visit: Payer: Self-pay

## 2019-06-12 ENCOUNTER — Ambulatory Visit: Payer: Federal, State, Local not specified - PPO | Admitting: Cardiology

## 2019-06-12 ENCOUNTER — Encounter: Payer: Self-pay | Admitting: Cardiology

## 2019-06-12 DIAGNOSIS — E669 Obesity, unspecified: Secondary | ICD-10-CM

## 2019-06-12 DIAGNOSIS — G4733 Obstructive sleep apnea (adult) (pediatric): Secondary | ICD-10-CM

## 2019-06-12 HISTORY — DX: Obesity, unspecified: E66.9

## 2019-06-12 NOTE — Progress Notes (Signed)
Cardiology Office Note:    Date:  06/12/2019   ID:  [name], DOB 1970/03/03, MRN [id number]  PCP:  Lucianne Lei, MD  Cardiologist:  No primary care provider on file.    Referring MD: Lucianne Lei, MD   Chief Complaint  Patient presents with  . Sleep Apnea    History of Present Illness:    [name] is a 49 y.o. male with a hx of PTSD and seizure disorder who was referred to Dr. Redmond Baseman with ENT for evaluation of OSA. He was apparently diagnosed with OSA in 2015 and says his AHI was high. He tried oral appliances and CPAP but had difficulty tolerating these due to prior PTSD from his [employer]. He hashadsevere excessive daytime sleepiness and very poor sleep at night only sleeping 4 hours if he is lucky. He has severe snoring and has to sleep in another room. He drinks coffee throughout the day to stay awake. He also has severe allergies which make it very difficult for him to breath at night with the mask on despite taking meds for allergies.He also had significant allergies that contributed as well.   Due to sx of excessive daytime sleepiness, he was referred to Dr. Redmond Baseman for evaluation of the [facility] device. He underwent repeat home sleep study showing severe OSA with an AHI of 45.1/hr with no central apnea. His Epworth sleepiness score was 12. Oxygen saturations dropped as low as 65%. He underwent drug induced sleep endoscopy on 01/27/2018 and was felt to be a good candidate for hypoglossal nerve stimulator.He underwent hypoglossal nerve stimulator implant on 12/27/2018 and was seen back in the office 02/06/2019 for activation of his device.  04/03/2019:  Seen in office for followup after activation of his device and was having problems with reduced effectiveness and increased sleepiness after rapid acclimation of settings and reached the top level on his functional range and settings were adjusted.    05/14/2019:  In lab sleep study showing severe O2 drop to  76% with loud snoring and AHI of 65/hr.    He is now here today for setting adjustments to try to get improvement in apneas and hypoxia.  He says that he has not gotten been getting too much sleep and is working 2 jobs.  On average he gets 4 hours of sleep nightly due to work. He has not had any problems with insomnia.     Past Medical History:  Diagnosis Date  . History of kidney stones    Due to dehydration  . Multiple allergies   . Obesity (BMI 30-39.9) 06/12/2019  . PTSD (post-traumatic stress disorder)   . Seizures ([place])    Several years ago  . Sleep apnea     Past Surgical History:  Procedure Laterality Date  . Bones reset     [employer]  . DRUG INDUCED ENDOSCOPY N/A 01/27/2018   Procedure: DRUG INDUCED ENDOSCOPY;  Surgeon: Melida Quitter, MD;  Location: [facility];  Service: ENT;  Laterality: N/A;  . IMPLANTATION OF HYPOGLOSSAL NERVE STIMULATOR N/A 12/27/2018   Procedure: IMPLANTATION OF HYPOGLOSSAL NERVE STIMULATOR;  Surgeon: Melida Quitter, MD;  Location: [facility];  Service: ENT;  Laterality: N/A;  . Removal of Shrapnel     [employer]    Current Medications: No outpatient medications have been marked as taking for the 06/12/19 encounter (Office Visit) with Sueanne Margarita, MD.     Allergies:   Other, Pork-derived products, and Dilantin [phenytoin]   Social History  Socioeconomic History  . Marital status: Married    Spouse name: Not on file  . Number of children: Not on file  . Years of education: Not on file  . Highest education level: Not on file  Occupational History  . Not on file  Tobacco Use  . Smoking status: Current Some Day Smoker    Types: Cigars  . Smokeless tobacco: Never Used  . Tobacco comment: one cigar a week  Substance and Sexual Activity  . Alcohol use: Not Currently  . Drug use: No  . Sexual activity: Yes  Other Topics Concern  . Not on file  Social History Narrative  . Not on file   Social Determinants of Health    Financial Resource Strain:   . Difficulty of Paying Living Expenses:   Food Insecurity:   . Worried About Programme researcher, broadcasting/film/video in the Last Year:   . Barista in the Last Year:   Transportation Needs:   . Freight forwarder (Medical):   Marland Kitchen Lack of Transportation (Non-Medical):   Physical Activity:   . Days of Exercise per Week:   . Minutes of Exercise per Session:   Stress:   . Feeling of Stress :   Social Connections:   . Frequency of Communication with Friends and Family:   . Frequency of Social Gatherings with Friends and Family:   . Attends Religious Services:   . Active Member of Clubs or Organizations:   . Attends Banker Meetings:   Marland Kitchen Marital Status:      Family History: The patient's family history is not on file.  ROS:   Please see the history of present illness.    ROS  All other systems reviewed and negative.   EKGs/Labs/Other Studies Reviewed:    The following studies were reviewed today: Sleep study  EKG:  EKG is not ordered today.    Recent Labs: 12/21/2018: BUN 7; Creatinine, Ser 0.92; Hemoglobin 14.7; Platelets 229; Potassium 4.0; Sodium 135   Recent Lipid Panel No results found for: CHOL, TRIG, HDL, CHOLHDL, VLDL, LDLCALC, LDLDIRECT  Physical Exam:    VS:  There were no vitals taken for this visit.    Wt Readings from Last 3 Encounters:  05/14/19 240 lb (108.9 kg)  03/29/19 249 lb (112.9 kg)  02/06/19 244 lb 12.8 oz (111 kg)     GEN:  Well nourished, well developed in no acute distress HEENT: Normal NECK: No JVD; No carotid bruits MUSCULOSKELETAL:  No edema; No deformity  SKIN: Warm and dry NEUROLOGIC:  Alert and oriented x 3 PSYCHIATRIC:  Normal affect   ASSESSMENT:    1. OSA (obstructive sleep apnea)   2. Obesity (BMI 30-39.9)    PLAN:    In order of problems listed above:  1.  OSA -intolerant to CPAP therapy due to severe claustrophobia and PTSD -s/p Inspire device -s/p sleep study last month with  continued severe OSA with nocturnal hypoxemia -he is back today for reprogramming of his device with Inspire Rep -Interrogation of device was performed with data as follows:  Stimulation levels:  Therapy 0.8-2.7V comfortable  Stim settings:  Amplitude changed from 2.7 to 1.5V    Patient control changed from 2-3V to 1.3-2.3V    Pulse width    Rate 33Hz     Start delay 30 min    Pause time 15 min    Therapy duration 8 hours     Electrodes switched from +-+ to -/-/+ -  he will see me back in 4 weeks for followup to see how he is doing    2.  Obesity -I have encouraged him to get into a routine exercise program and cut back on carbs and portions.    Medication Adjustments/Labs and Tests Ordered: Current medicines are reviewed at length with the patient today.  Concerns regarding medicines are outlined above.  No orders of the defined types were placed in this encounter.  No orders of the defined types were placed in this encounter.   Signed, Armanda Magic, MD  06/12/2019 4:57 PM    [facility]

## 2019-06-19 DIAGNOSIS — F4323 Adjustment disorder with mixed anxiety and depressed mood: Secondary | ICD-10-CM | POA: Diagnosis not present

## 2019-06-26 DIAGNOSIS — F4323 Adjustment disorder with mixed anxiety and depressed mood: Secondary | ICD-10-CM | POA: Diagnosis not present

## 2019-06-27 ENCOUNTER — Other Ambulatory Visit: Payer: Self-pay

## 2019-06-27 ENCOUNTER — Ambulatory Visit (INDEPENDENT_AMBULATORY_CARE_PROVIDER_SITE_OTHER): Payer: Federal, State, Local not specified - PPO

## 2019-06-27 ENCOUNTER — Ambulatory Visit: Payer: Federal, State, Local not specified - PPO | Admitting: Podiatry

## 2019-06-27 DIAGNOSIS — M2011 Hallux valgus (acquired), right foot: Secondary | ICD-10-CM

## 2019-06-27 DIAGNOSIS — Q666 Other congenital valgus deformities of feet: Secondary | ICD-10-CM

## 2019-06-27 DIAGNOSIS — Z01818 Encounter for other preprocedural examination: Secondary | ICD-10-CM | POA: Diagnosis not present

## 2019-06-27 NOTE — Patient Instructions (Signed)
Pre-Operative Instructions  Congratulations, you have decided to take an important step towards improving your quality of life.  You can be assured that the doctors and staff at [facility] will be with you every step of the way.  Here are some important things you should know:  1. Plan to be at the surgery center/hospital at least 1 (one) hour prior to your scheduled time, unless otherwise directed by the surgical center/hospital staff.  You must have a responsible adult accompany you, remain during the surgery and drive you home.  Make sure you have directions to the surgical center/hospital to ensure you arrive on time. 2. If you are having surgery at [facility] or [facility], you will need a copy of your medical history and physical form from your family physician within one month prior to the date of surgery. We will give you a form for your primary physician to complete.  3. We make every effort to accommodate the date you request for surgery.  However, there are times where surgery dates or times have to be moved.  We will contact you as soon as possible if a change in schedule is required.   4. No aspirin/ibuprofen for one week before surgery.  If you are on aspirin, any non-steroidal anti-inflammatory medications (Mobic, Aleve, Ibuprofen) should not be taken seven (7) days prior to your surgery.  You make take Tylenol for pain prior to surgery.  5. Medications - If you are taking daily heart and blood pressure medications, seizure, reflux, allergy, asthma, anxiety, pain or diabetes medications, make sure you notify the surgery center/hospital before the day of surgery so they can tell you which medications you should take or avoid the day of surgery. 6. No food or drink after midnight the night before surgery unless directed otherwise by surgical center/hospital staff. 7. No alcoholic beverages 24-hours prior to surgery.  No smoking 24-hours prior or 24-hours after  surgery. 8. Wear loose pants or shorts. They should be loose enough to fit over bandages, boots, and casts. 9. Don't wear slip-on shoes. Sneakers are preferred. 10. Bring your boot with you to the surgery center/hospital.  Also bring crutches or a walker if your physician has prescribed it for you.  If you do not have this equipment, it will be provided for you after surgery. 11. If you have not been contacted by the surgery center/hospital by the day before your surgery, call to confirm the date and time of your surgery. 12. Leave-time from work may vary depending on the type of surgery you have.  Appropriate arrangements should be made prior to surgery with your employer. 13. Prescriptions will be provided immediately following surgery by your doctor.  Fill these as soon as possible after surgery and take the medication as directed. Pain medications will not be refilled on weekends and must be approved by the doctor. 14. Remove nail polish on the operative foot and avoid getting pedicures prior to surgery. 15. Wash the night before surgery.  The night before surgery wash the foot and leg well with water and the antibacterial soap provided. Be sure to pay special attention to beneath the toenails and in between the toes.  Wash for at least three (3) minutes. Rinse thoroughly with water and dry well with a towel.  Perform this wash unless told not to do so by your physician.  Enclosed: 1 Ice pack (please put in freezer the night before surgery)   1 Hibiclens skin cleaner     Pre-op instructions  If you have any questions regarding the instructions, please do not hesitate to call our office.  [place]: [address], [place], [place] [address] -- [phone]  [place]: [address] [place], [place] [address] -- [phone]  [place]: [address], [place], [place] [address] -- [phone]   Website: [email or web address] 

## 2019-06-28 ENCOUNTER — Encounter: Payer: Self-pay | Admitting: Podiatry

## 2019-06-28 NOTE — Progress Notes (Signed)
Subjective:  Patient ID: [name], male    DOB: 22-Jul-1970,  MRN: [id number]  Chief Complaint  Patient presents with  . Foot Pain    pt is here for a possible bunion, and a possible hammer toe, pt pain has been going on for a couple of months, pain is aggravated when walking on it.    49 y.o. male presents with the above complaint.  Patient presents with complaint of right bunion deformity.  Patient states been going on for 2 to 3 months has progressive gotten worse.  Patient states pain is elevated when ambulating.  Pain is also present when applying shoes that are rubbing against it.  He has tried multiple over-the-counter therapy that includes offloading as well as taping it bright gear shoe gear modification but has failed them all.  Patient would like to discuss surgical intervention at this time.  He would also like to discuss preventing the bunions from coming back in the future.  He denies any other acute complaints.  His pain scale 6 out of 10.  He has not seen him or anyone else prior to seeing me.  Review of Systems: Negative except as noted in the HPI. Denies N/V/F/Ch.  Past Medical History:  Diagnosis Date  . History of kidney stones    Due to dehydration  . Multiple allergies   . Obesity (BMI 30-39.9) 06/12/2019  . PTSD (post-traumatic stress disorder)   . Seizures ([facility])    Several years ago  . Sleep apnea     Current Outpatient Medications:  .  cetirizine (ZYRTEC) 10 MG tablet, Take 10 mg by mouth daily., Disp: , Rfl:  .  fexofenadine-pseudoephedrine (ALLEGRA-D 24) 180-240 MG 24 hr tablet, Take 1 tablet by mouth at bedtime., Disp: , Rfl:  .  fluticasone (FLONASE) 50 MCG/ACT nasal spray, Place 1 spray into both nostrils as needed for allergies or rhinitis., Disp: , Rfl:   Social History   Tobacco Use  Smoking Status Current Some Day Smoker  . Types: Cigars  Smokeless Tobacco Never Used  Tobacco Comment   one cigar a week    Allergies  Allergen Reactions    . Other Anaphylaxis  . Pork-Derived Products Anaphylaxis  . Dilantin [Phenytoin] Other (See Comments)    Causes seizure    Objective:  There were no vitals filed for this visit. There is no height or weight on file to calculate BMI. Constitutional Well developed. Well nourished.  Vascular Dorsalis pedis pulses palpable bilaterally. Posterior tibial pulses palpable bilaterally. Capillary refill normal to all digits.  No cyanosis or clubbing noted. Pedal hair growth normal.  Neurologic Normal speech. Oriented to person, place, and time. Epicritic sensation to light touch grossly present bilaterally.  Dermatologic Nails well groomed and normal in appearance. No open wounds. No skin lesions.  Orthopedic: Normal joint ROM without pain or crepitus bilaterally. Hallux abductovalgus deformity present Left 1st MPJ full range of motion. Left 1st TMT without gross hypermobility. Right 1st MPJ diminished range of motion.  This is a tracking not track bound deformity.  No intra-articular pain noted. Right 1st TMT without gross hypermobility. Lesser digital contractures present bilaterally.   Radiographs: Taken and reviewed. Hallux abductovalgus deformity present.3 views of skeletally mature adult foot right: Moderate bunion deformity noted with increase in intermetatarsal angle sesamoid position is 5 out of 7.  Hallux abductor's angle increase.  Subchondral sclerosis present.  There is decreasing calcaneal inclination angle increase in talar declination angle anterior break in the cyma line  mild elevatus present.  Posterior heel spurring noted.  Metatarsal parabola intact.  Assessment:   1. Hallux valgus of right foot   2. Preop examination   3. Pes planovalgus    Plan:  Patient was evaluated and treated and all questions answered.  Hallux abductovalgus deformity,  -XR as above. -Patient has failed all conservative therapy and wishes to proceed with surgical intervention. All risks,  benefits, and alternatives discussed with patient. No guarantees given. Consent reviewed and signed by patient. Post-op course explained at length. -Planned procedures: Right chevron osteotomy with possible Akin osteotomy with screw fixation -Risk factors: None -I discussed with the patient the etiology of bunion and various treatment options were extensively discussed with the patient.  Given that patient has failed conservative therapy, I believe he will benefit from surgical intervention to help address the bunion pain that he is experiencing.  Patient agrees with the plan would like to proceed with a bunion surgery.  I also discussed the bunion and its relationship with pes planus deformity and the importance of orthotics management.  He will be scheduled to see [name] for custom-made orthotics to help address the pes planus to prevent the bunion from getting worse and to control the hindfoot motion as well as support the arch of the foot. -I discussed my postop protocol in extensive detail patient will be weightbearing as tolerated in a cam boot. -Informed surgical risk consent was reviewed and read aloud to the patient.  I reviewed the films.  I have discussed my findings with the patient in great detail.  I have discussed all risks including but not limited to infection, stiffness, scarring, limp, disability, deformity, damage to blood vessels and nerves, numbness, poor healing, need for braces, arthritis, chronic pain, amputation, death.  All benefits and realistic expectations discussed in great detail.  I have made no promises as to the outcome.  I have provided realistic expectations.  I have offered the patient a 2nd opinion, which they have declined and assured me they preferred to proceed despite the risks -A total of 46 minutes was spent in direct patient care as well as pre and post patient encounter activities.  This includes documentation as well as reviewing patient chart for labs, imaging,  past medical, surgical, social, and family history as documented in the EMR.  I have reviewed medication allergies as documented in EMR.  I discussed the etiology of condition and treatment options from conservative to surgical care.  All risks and benefit of the treatment course was discussed in detail.  All questions were answered and return appointment was discussed.  Since the visit completed in an ambulatory/outpatient setting, the patient and/or parent/guardian has been advised to contact the providers office for worsening condition and seek medical treatment and/or call 911 if the patient deems either is necessary.

## 2019-06-29 ENCOUNTER — Encounter (HOSPITAL_COMMUNITY): Payer: Self-pay | Admitting: Emergency Medicine

## 2019-06-29 ENCOUNTER — Other Ambulatory Visit: Payer: Self-pay

## 2019-06-29 ENCOUNTER — Emergency Department (HOSPITAL_COMMUNITY)
Admission: EM | Admit: 2019-06-29 | Discharge: 2019-06-29 | Disposition: A | Discharge status: Home / Self Care | Payer: Federal, State, Local not specified - PPO | Attending: Emergency Medicine | Admitting: Emergency Medicine

## 2019-06-29 ENCOUNTER — Emergency Department (HOSPITAL_COMMUNITY): Payer: Federal, State, Local not specified - PPO

## 2019-06-29 DIAGNOSIS — R0789 Other chest pain: Secondary | ICD-10-CM | POA: Diagnosis not present

## 2019-06-29 DIAGNOSIS — Z79899 Other long term (current) drug therapy: Secondary | ICD-10-CM | POA: Diagnosis not present

## 2019-06-29 DIAGNOSIS — Z720 Tobacco use: Secondary | ICD-10-CM | POA: Diagnosis not present

## 2019-06-29 DIAGNOSIS — R079 Chest pain, unspecified: Secondary | ICD-10-CM | POA: Diagnosis not present

## 2019-06-29 DIAGNOSIS — Z20822 Contact with and (suspected) exposure to covid-19: Secondary | ICD-10-CM | POA: Diagnosis not present

## 2019-06-29 DIAGNOSIS — R519 Headache, unspecified: Secondary | ICD-10-CM | POA: Diagnosis not present

## 2019-06-29 LAB — CBC
HCT: 44.6 % (ref 39.0–52.0)
Hemoglobin: 15.3 g/dL (ref 13.0–17.0)
MCH: 30.4 pg (ref 26.0–34.0)
MCHC: 34.3 g/dL (ref 30.0–36.0)
MCV: 88.5 fL (ref 80.0–100.0)
Platelets: 253 10*3/uL (ref 150–400)
RBC: 5.04 MIL/uL (ref 4.22–5.81)
RDW: 12.6 % (ref 11.5–15.5)
WBC: 7.2 10*3/uL (ref 4.0–10.5)
nRBC: 0 % (ref 0.0–0.2)

## 2019-06-29 LAB — BASIC METABOLIC PANEL
Anion gap: 12 (ref 5–15)
BUN: 8 mg/dL (ref 6–20)
CO2: 23 mmol/L (ref 22–32)
Calcium: 9.6 mg/dL (ref 8.9–10.3)
Chloride: 102 mmol/L (ref 98–111)
Creatinine, Ser: 1.04 mg/dL (ref 0.61–1.24)
GFR calc Af Amer: >60 mL/min (ref >60)
GFR calc non Af Amer: >60 mL/min (ref >60)
Glucose, Bld: 153 mg/dL — ABNORMAL HIGH (ref 70–99)
Potassium: 4 mmol/L (ref 3.5–5.1)
Sodium: 137 mmol/L (ref 135–145)

## 2019-06-29 LAB — RESPIRATORY PANEL BY RT PCR (FLU A&B, COVID)
Influenza A by PCR: NEGATIVE
Influenza B by PCR: NEGATIVE
SARS Coronavirus 2 by RT PCR: NEGATIVE

## 2019-06-29 LAB — TROPONIN I (HIGH SENSITIVITY)
Troponin I (High Sensitivity): 3 ng/L (ref <18)
Troponin I (High Sensitivity): 3 ng/L (ref <18)

## 2019-06-29 MED ORDER — SODIUM CHLORIDE 0.9% FLUSH
3.0000 mL | Freq: Once | INTRAVENOUS | Status: AC
  Administered 2019-06-29: 3 mL via INTRAVENOUS

## 2019-06-29 MED ORDER — KETOROLAC TROMETHAMINE 15 MG/ML IJ SOLN
15.0000 mg | Freq: Once | INTRAMUSCULAR | Status: AC
  Administered 2019-06-29: 15 mg via INTRAVENOUS
  Filled 2019-06-29: qty 1

## 2019-06-29 NOTE — ED Provider Notes (Signed)
[facility] EMERGENCY DEPARTMENT Provider Note   CSN: [id number] Arrival date & time: 06/29/19  1135     History Chief Complaint  Patient presents with  . Chest Pain  . Headache    [name] is a 49 y.o. male.  Patient is a 49 year old gentleman with past medical history of kidney stones, PTSD, sleep apnea presenting to the emergency department for episode of chest pain and headache.  Patient reports that last night he was sitting down and felt a burning and stabbing sensation in the center of his chest.  He reports that this was associated with a headache and pain radiating into his right arm.  This lasted for 3 to 5 minutes and then subsided on its own.  Reports that he woke up in the middle of the night and was having a headache which has been persistent so this morning.  No history of headaches in the past.  Reports that this morning while at work he was again sitting down and then became diaphoretic and felt unwell.  He is unsure if he had chest pain at that time.  Currently he feels okay other than a headache which radiates from the back of his head into his forehead.        Past Medical History:  Diagnosis Date  . History of kidney stones    Due to dehydration  . Multiple allergies   . Obesity (BMI 30-39.9) 06/12/2019  . PTSD (post-traumatic stress disorder)   . Seizures (HCC)    Several years ago  . Sleep apnea     Patient Active Problem List   Diagnosis Date Noted  . Obesity (BMI 30-39.9) 06/12/2019  . OSA (obstructive sleep apnea) 10/06/2018    Past Surgical History:  Procedure Laterality Date  . Bones reset     [employer]  . DRUG INDUCED ENDOSCOPY N/A 01/27/2018   Procedure: DRUG INDUCED ENDOSCOPY;  Surgeon: Christia Reading, MD;  Location:  SURGERY CENTER;  Service: ENT;  Laterality: N/A;  . IMPLANTATION OF HYPOGLOSSAL NERVE STIMULATOR N/A 12/27/2018   Procedure: IMPLANTATION OF HYPOGLOSSAL NERVE STIMULATOR;  Surgeon: Christia Reading, MD;  Location: [facility] OR;  Service: ENT;  Laterality: N/A;  . Removal of Shrapnel     [employer]       History reviewed. No pertinent family history.  Social History   Tobacco Use  . Smoking status: Current Some Day Smoker    Types: Cigars  . Smokeless tobacco: Never Used  . Tobacco comment: one cigar a week  Substance Use Topics  . Alcohol use: Not Currently  . Drug use: No    Home Medications Prior to Admission medications   Medication Sig Start Date End Date Taking? Authorizing Provider  cetirizine (ZYRTEC) 10 MG tablet Take 10 mg by mouth daily.   Yes [provider]  fexofenadine-pseudoephedrine (ALLEGRA-D 24) 180-240 MG 24 hr tablet Take 1 tablet by mouth at bedtime.   Yes [provider]  fluticasone (FLONASE) 50 MCG/ACT nasal spray Place 1 spray into both nostrils as needed for allergies or rhinitis.   Yes [provider]  Multiple Vitamins-Minerals (MULTIVITAMIN WITH MINERALS) tablet Take 1 tablet by mouth daily.   Yes [provider]    Allergies    Other, Pork-derived products, and Dilantin [phenytoin]  Review of Systems   Review of Systems  Physical Exam Updated Vital Signs BP (!) 135/93   Pulse 77   Temp 98 F (36.7 C) (Oral)   Resp 20  Ht 6\' 1"  (1.854 m)   Wt 108.9 kg   SpO2 99%   BMI 31.66 kg/m   Physical Exam  ED Results / Procedures / Treatments   Labs (all labs ordered are listed, but only abnormal results are displayed) Labs Reviewed  BASIC METABOLIC PANEL - Abnormal; Notable for the following components:      Result Value   Glucose, Bld 153 (*)    All other components within normal limits  RESPIRATORY PANEL BY RT PCR (FLU A&B, COVID)  CBC  TROPONIN I (HIGH SENSITIVITY)  TROPONIN I (HIGH SENSITIVITY)    EKG EKG Interpretation  Date/Time:  Friday Jun 29 2019 11:52:50 EDT Ventricular Rate:  93 PR Interval:  136 QRS Duration: 76 QT Interval:  318 QTC Calculation: 395 R Axis:   20 Text  Interpretation: Normal sinus rhythm Cannot rule out Anterior infarct , age undetermined T wave abnormality No significant change since last tracing Artifact Abnormal ECG Confirmed by Carmin Muskrat [id number]) on 06/29/2019 12:04:09 PM   Radiology DG Chest 2 View  Result Date: 06/29/2019 CLINICAL DATA:  Chest pain. EXAM: CHEST - 2 VIEW COMPARISON:  12/27/2018 FINDINGS: The heart size and pulmonary vascularity are normal and the lungs are clear. No effusions. Hypoglossal nerve stimulator overlies the right hemithorax. Bones are normal. IMPRESSION: No active cardiopulmonary disease. Electronically Signed   By: Lorriane Shire M.D.   On: 06/29/2019 12:14   DG Foot Complete Right  Result Date: 06/27/2019 Please see detailed radiograph report in office note.   Procedures Procedures (including critical care time)  Medications Ordered in ED Medications  sodium chloride flush (NS) 0.9 % injection 3 mL (3 mLs Intravenous Given 06/29/19 1517)  ketorolac (TORADOL) 15 MG/ML injection 15 mg (15 mg Intravenous Given 06/29/19 1517)    ED Course  I have reviewed the triage vital signs and the nursing notes.  Pertinent labs & imaging results that were available during my care of the patient were reviewed by me and considered in my medical decision making (see chart for details).  Clinical Course as of Jun 29 1522  Fri Jun 29, 2019  1450 Patient with 2 episodes of atypical chest pain as well as headache since last night.  He is well-appearing on exam with no neurological deficits and no chest pain currently.  Plan to work-up with labs, troponin, chest x-ray.  Patient blood pressure improved with rest and headache improved with Toradol.  Work-up is reassuring.  Discussed head CT given the new onset of headache and patient reports that he does not feel he needs to have this done.  We did agree on Covid swabbing him given his symptoms.  Otherwise he can follow-up with primary medical doctor.  He agrees with plan.    [KM]    Clinical Course User Index [KM] Kristine Royal   MDM Rules/Calculators/A&P                      Based on review of vitals, medical screening exam, lab work and/or imaging, there does not appear to be an acute, emergent etiology for the patient's symptoms. Counseled pt on good return precautions and encouraged both PCP and ED follow-up as needed.  Prior to discharge, I also discussed incidental imaging findings with patient in detail and advised appropriate, recommended follow-up in detail.  Clinical Impression: 1. Atypical chest pain     Disposition: Discharge  Prior to providing a prescription for a controlled substance, I independently reviewed the  patient's recent prescription history on the [place] Controlled Substance Reporting System. The patient had no recent or regular prescriptions and was deemed appropriate for a brief, less than 3 day prescription of narcotic for acute analgesia.  This note was prepared with assistance of Conservation officer, historic buildings. Occasional wrong-word or sound-a-like substitutions may have occurred due to the inherent limitations of voice recognition software.  Final Clinical Impression(s) / ED Diagnoses Final diagnoses:  Atypical chest pain    Rx / DC Orders ED Discharge Orders    None       Arlyn Dunning, PA-C 06/29/19 1524    Terald Sleeper, MD 06/29/19 1900

## 2019-06-29 NOTE — ED Triage Notes (Signed)
Pt arrives to ED from work with complaints of chest tightness and diaphoresis that started last night and then again while at work today. Patient states he also had a headache located in the back that has been bothering him since yesterday and today as well.

## 2019-06-29 NOTE — Discharge Instructions (Signed)
Thank you for allowing me to care for you today. Please return to the emergency department if you have new or worsening symptoms. Take your medications as instructed.  ° °

## 2019-07-02 DIAGNOSIS — F4323 Adjustment disorder with mixed anxiety and depressed mood: Secondary | ICD-10-CM | POA: Diagnosis not present

## 2019-07-05 DIAGNOSIS — R739 Hyperglycemia, unspecified: Secondary | ICD-10-CM | POA: Diagnosis not present

## 2019-07-05 DIAGNOSIS — G4733 Obstructive sleep apnea (adult) (pediatric): Secondary | ICD-10-CM | POA: Diagnosis not present

## 2019-07-05 DIAGNOSIS — M25552 Pain in left hip: Secondary | ICD-10-CM | POA: Diagnosis not present

## 2019-07-05 DIAGNOSIS — M4805 Spinal stenosis, thoracolumbar region: Secondary | ICD-10-CM | POA: Diagnosis not present

## 2019-07-05 DIAGNOSIS — I10 Essential (primary) hypertension: Secondary | ICD-10-CM | POA: Diagnosis not present

## 2019-07-05 DIAGNOSIS — J301 Allergic rhinitis due to pollen: Secondary | ICD-10-CM | POA: Diagnosis not present

## 2019-07-10 DIAGNOSIS — F4323 Adjustment disorder with mixed anxiety and depressed mood: Secondary | ICD-10-CM | POA: Diagnosis not present

## 2019-07-11 ENCOUNTER — Telehealth: Payer: Self-pay

## 2019-07-11 NOTE — Telephone Encounter (Signed)
DOS 07/30/2019  [place] BUNIONECTOMY RT - 45625 CORRECTION BUNION BY PHALANX OSTEOTOMY RT - 28298  BCBS FED EFFECTIVE DATE - 10/14/2012    In-Network   Max Per Benefit Period Year-to-Date Remaining     CoInsurance         Deductible $350.00 $36.62     Out-Of-Pocket 3 $5,000.00 $6,389.37  Copay Not Applicable Coinsurance 35% Authorization Required No

## 2019-07-12 NOTE — Progress Notes (Signed)
Virtual Visit via Telephone Note   This visit type was conducted due to national recommendations for restrictions regarding the COVID-19 Pandemic (e.g. social distancing) in an effort to limit this patient's exposure and mitigate transmission in our community.  Due to his co-morbid illnesses, this patient is at least at moderate risk for complications without adequate follow up.  This format is felt to be most appropriate for this patient at this time.  The patient did not have access to video technology/had technical difficulties with video requiring transitioning to audio format only (telephone).  All issues noted in this document were discussed and addressed.  No physical exam could be performed with this format.  Please refer to the patient's chart for his  consent to telehealth for [facility].   Evaluation Performed:  Follow-up visit  This visit type was conducted due to national recommendations for restrictions regarding the COVID-19 Pandemic (e.g. social distancing).  This format is felt to be most appropriate for this patient at this time.  All issues noted in this document were discussed and addressed.  No physical exam was performed (except for noted visual exam findings with Video Visits).  Please refer to the patient's chart (MyChart message for video visits and phone note for telephone visits) for the patient's consent to telehealth for [facility].  Date:  07/13/2019   ID:  [name], DOB [birth year], MRN [id number]  Patient Location:  Home  Provider location:   [place]  PCP:  Renaye Rakers, MD  Sleep medicine:  Armanda Magic, MD Electrophysiologist:  None   Chief Complaint:  OSA  History of Present Illness:    [name] is a 49 y.o. male who presents via audio/video conferencing for a telehealth visit today.    [name] is a 49 y.o. male with a hx of PTSD and seizure disorder who was referred to Dr. Jenne Pane with ENT for evaluation of OSA. He was  apparently diagnosed with OSA in 2015 and says his AHI was high. He tried oral appliances and CPAP but had difficulty tolerating these due to prior PTSD from his [employer]. He hashadsevere excessive daytime sleepiness and very poor sleep at night only sleeping 4 hours if he is lucky. He has severe snoring and has to sleep in another room. He drinks coffee throughout the day to stay awake. He also has severe allergies which make it very difficult for him to breath at night with the mask on despite taking meds for allergies.He also had significant allergies that contributed as well.   Due to sx of excessive daytime sleepiness, he was referred to Dr. Jenne Pane for evaluation of the [facility] device. He underwent repeat home sleep study showing severe OSA with an AHI of 45.1/hr with no central apnea. His Epworth sleepiness score was 12. Oxygen saturations dropped as low as 65%. He underwent drug induced sleep endoscopy on 01/27/2018 and was felt to be a good candidate for hypoglossal nerve stimulator.He underwent hypoglossal nerve stimulator implant on 12/27/2018 and was seen back in the office 02/06/2019 for activation of his device.  04/03/2019:  Seen in office for followup after activation of his device and was having problems with reduced effectiveness and increased sleepiness after rapid acclimation of settings and reached the top level on his functional range and settings were adjusted.    05/14/2019:  In lab sleep study showing severe O2 drop to 76% with loud snoring and AHI of 65/hr.    06/12/2019:  In office for setting adjustments  He is now here for followup on televisit.  Since the last visit with changes made, he is feeling great.  He is now sleeping through the night without waking up and feels great in the am.  He is at level 4 and has no tongue soreness at this level. If he goes any higher it is uncomfortable on his tongue.  He feels rested in the am and has no daytime sleepiness.   His uses a O2 monitor and has not had any desaturations.  He thinks he still has some mild snoring.  The only thing that limits his sleep now is his working hours.  He is currently not exercising but plans to get back into it once he has foot surgery.     The patient does not have symptoms concerning for COVID-19 infection (fever, chills, cough, or new shortness of breath).    Prior CV studies:   The following studies were reviewed today:  none  Past Medical History:  Diagnosis Date  . History of kidney stones    Due to dehydration  . Multiple allergies   . Obesity (BMI 30-39.9) 06/12/2019  . PTSD (post-traumatic stress disorder)   . Seizures ([place])    Several years ago  . Sleep apnea    Past Surgical History:  Procedure Laterality Date  . Bones reset     [employer]  . DRUG INDUCED ENDOSCOPY N/A 01/27/2018   Procedure: DRUG INDUCED ENDOSCOPY;  Surgeon: Melida Quitter, MD;  Location: [place];  Service: ENT;  Laterality: N/A;  . IMPLANTATION OF HYPOGLOSSAL NERVE STIMULATOR N/A 12/27/2018   Procedure: IMPLANTATION OF HYPOGLOSSAL NERVE STIMULATOR;  Surgeon: Melida Quitter, MD;  [facility];  Service: ENT;  Laterality: N/A;  . Removal of Shrapnel     [employer]     Current Meds  Medication Sig  . cetirizine (ZYRTEC) 10 MG tablet Take 10 mg by mouth daily.  . fexofenadine-pseudoephedrine (ALLEGRA-D 24) 180-240 MG 24 hr tablet Take 1 tablet by mouth at bedtime.  . fluticasone (FLONASE) 50 MCG/ACT nasal spray Place 1 spray into both nostrils as needed for allergies or rhinitis.  . Multiple Vitamins-Minerals (MULTIVITAMIN WITH MINERALS) tablet Take 1 tablet by mouth daily.     Allergies:   Other, Pork-derived products, and Dilantin [phenytoin]   Social History   Tobacco Use  . Smoking status: Current Some Day Smoker    Types: Cigars  . Smokeless tobacco: Never Used  . Tobacco comment: one cigar a week  Substance Use Topics  . Alcohol use: Not Currently  . Drug  use: No     Family Hx: The patient's family history is not on file.  ROS:   Please see the history of present illness.     All other systems reviewed and are negative.   Labs/Other Tests and Data Reviewed:    Recent Labs: 06/29/2019: BUN 8; Creatinine, Ser 1.04; Hemoglobin 15.3; Platelets 253; Potassium 4.0; Sodium 137   Recent Lipid Panel No results found for: CHOL, TRIG, HDL, CHOLHDL, LDLCALC, LDLDIRECT  Wt Readings from Last 3 Encounters:  07/13/19 242 lb (109.8 kg)  06/29/19 240 lb (108.9 kg)  05/14/19 240 lb (108.9 kg)     Objective:    Vital Signs:  Pulse 82   Ht 6\' 1"  (1.854 m)   Wt 242 lb (109.8 kg)   SpO2 98%   BMI 31.93 kg/m      ASSESSMENT & PLAN:    1.  OSA -intolerant to CPAP therapy  due to severe claustrophobia and PTSD -s/p Inspire device -s/p sleep study last month with continued severe OSA with nocturnal hypoxemia -he is back today for followup after reprogramming of his device at last OV -he is doing very well and sleeping through the night with no daytime sleepiness at level 4 -no desats on his pulse ox -I will get a home sleep study to document residual apnea  2.  Obesity -I have encouraged him to get into a routine exercise program and cut back on carbs and portions.   COVID-19 Education: The signs and symptoms of COVID-19 were discussed with the patient and how to seek care for testing (follow up with PCP or arrange E-visit).  The importance of social distancing was discussed today.  Patient Risk:   After full review of this patient's clinical status, I feel that they are at least moderate risk at this time.  Time:   Today, I have spent 15 minutes on telemedicine discussing medical problems including OSA and obesity and reviewing patient's chart including last OV note.  Medication Adjustments/Labs and Tests Ordered: Current medicines are reviewed at length with the patient today.  Concerns regarding medicines are outlined above.    Tests Ordered: No orders of the defined types were placed in this encounter.  Medication Changes: No orders of the defined types were placed in this encounter.   Disposition:  Follow up in 6 month(s)  Signed, Armanda Magic, MD  07/13/2019 8:05 AM     [facility]

## 2019-07-13 ENCOUNTER — Telehealth (INDEPENDENT_AMBULATORY_CARE_PROVIDER_SITE_OTHER): Payer: Federal, State, Local not specified - PPO | Admitting: Cardiology

## 2019-07-13 VITALS — HR 82 | Ht 73.0 in | Wt 242.0 lb

## 2019-07-13 DIAGNOSIS — G4733 Obstructive sleep apnea (adult) (pediatric): Secondary | ICD-10-CM | POA: Diagnosis not present

## 2019-07-13 DIAGNOSIS — E669 Obesity, unspecified: Secondary | ICD-10-CM | POA: Diagnosis not present

## 2019-07-13 NOTE — Patient Instructions (Signed)
Medication Instructions:  Your physician recommends that you continue on your current medications as directed. Please refer to the Current Medication list given to you today.  *If you need a refill on your cardiac medications before your next appointment, please call your pharmacy*   Lab Work: none If you have labs (blood work) drawn today and your tests are completely normal, you will receive your results only by: Marland Kitchen MyChart Message (if you have MyChart) OR . A paper copy in the mail If you have any lab test that is abnormal or we need to change your treatment, we will call you to review the results.   Testing/Procedures: Your physician has recommended that you have a sleep study. This test records several body functions during sleep, including: brain activity, eye movement, oxygen and carbon dioxide blood levels, heart rate and rhythm, breathing rate and rhythm, the flow of air through your mouth and nose, snoring, body muscle movements, and chest and belly movement.    Follow-Up: At [facility], you and your health needs are our priority.  As part of our continuing mission to provide you with exceptional heart care, we have created designated Provider Care Teams.  These Care Teams include your primary Cardiologist (physician) and Advanced Practice Providers (APPs -  Physician Assistants and Nurse Practitioners) who all work together to provide you with the care you need, when you need it.  We recommend signing up for the patient portal called "MyChart".  Sign up information is provided on this After Visit Summary.  MyChart is used to connect with patients for Virtual Visits (Telemedicine).  Patients are able to view lab/test results, encounter notes, upcoming appointments, etc.  Non-urgent messages can be sent to your provider as well.   To learn more about what you can do with MyChart, go to [email or web address].    Your next appointment:   6 month(s)  The format for your next  appointment:   Virtual Visit   Provider:   Armanda Magic, MD   Other Instructions

## 2019-07-17 ENCOUNTER — Telehealth: Payer: Self-pay | Admitting: *Deleted

## 2019-07-17 DIAGNOSIS — F4323 Adjustment disorder with mixed anxiety and depressed mood: Secondary | ICD-10-CM | POA: Diagnosis not present

## 2019-07-17 DIAGNOSIS — G4733 Obstructive sleep apnea (adult) (pediatric): Secondary | ICD-10-CM

## 2019-07-17 NOTE — Telephone Encounter (Signed)
-----   Message from Dossie Arbour, RN sent at 07/13/2019  9:18 AM EDT ----- Regarding: home sleep study Hi Nina, Dr Mayford Knife would like this patient to have a home sleep study for sleep apnea. Thanks, Dennie Bible

## 2019-07-17 NOTE — Telephone Encounter (Signed)
-----   Message from Patricia L Adelman, RN sent at 07/13/2019  9:18 AM EDT ----- Regarding: home sleep study Hi [name], Dr Turner would like this patient to have a home sleep study for sleep apnea. Thanks, Pat  

## 2019-07-17 NOTE — Telephone Encounter (Signed)
Patient is aware and agreeable to Home Sleep Study through [facility]. Patient is scheduled for 09/21/19 at 11:00 to pick up home sleep kit and meet with Respiratory therapist at [facility]. Patient is aware that if this appointment date and time does not work for them they should contact Artis Delay directly at [phone]. Patient is aware that a sleep packet will be sent from [facility] in week. Patient is agreeable to treatment and thankful for call.

## 2019-07-18 DIAGNOSIS — I10 Essential (primary) hypertension: Secondary | ICD-10-CM | POA: Diagnosis not present

## 2019-07-18 DIAGNOSIS — F064 Anxiety disorder due to known physiological condition: Secondary | ICD-10-CM | POA: Diagnosis not present

## 2019-07-18 DIAGNOSIS — R635 Abnormal weight gain: Secondary | ICD-10-CM | POA: Diagnosis not present

## 2019-07-18 DIAGNOSIS — F5101 Primary insomnia: Secondary | ICD-10-CM | POA: Diagnosis not present

## 2019-07-23 ENCOUNTER — Encounter: Payer: Self-pay | Admitting: Podiatry

## 2019-07-24 DIAGNOSIS — F4323 Adjustment disorder with mixed anxiety and depressed mood: Secondary | ICD-10-CM | POA: Diagnosis not present

## 2019-07-30 ENCOUNTER — Encounter: Payer: Self-pay | Admitting: Podiatry

## 2019-07-30 DIAGNOSIS — M2041 Other hammer toe(s) (acquired), right foot: Secondary | ICD-10-CM | POA: Diagnosis not present

## 2019-07-30 DIAGNOSIS — M21611 Bunion of right foot: Secondary | ICD-10-CM | POA: Diagnosis not present

## 2019-07-30 DIAGNOSIS — M25571 Pain in right ankle and joints of right foot: Secondary | ICD-10-CM | POA: Diagnosis not present

## 2019-07-30 DIAGNOSIS — G4733 Obstructive sleep apnea (adult) (pediatric): Secondary | ICD-10-CM | POA: Diagnosis not present

## 2019-07-30 DIAGNOSIS — M2011 Hallux valgus (acquired), right foot: Secondary | ICD-10-CM | POA: Diagnosis not present

## 2019-07-30 MED ORDER — OXYCODONE-ACETAMINOPHEN 10-325 MG PO TABS: 1.0000 | ORAL_TABLET | Freq: Four times a day (QID) | ORAL | 0 refills | Status: AC | PRN

## 2019-07-30 MED ORDER — IBUPROFEN 800 MG PO TABS: 800.0000 mg | ORAL_TABLET | Freq: Four times a day (QID) | ORAL | 1 refills | Status: AC | PRN

## 2019-08-07 DIAGNOSIS — F4323 Adjustment disorder with mixed anxiety and depressed mood: Secondary | ICD-10-CM | POA: Diagnosis not present

## 2019-08-08 ENCOUNTER — Other Ambulatory Visit: Payer: Self-pay

## 2019-08-08 ENCOUNTER — Ambulatory Visit (INDEPENDENT_AMBULATORY_CARE_PROVIDER_SITE_OTHER): Payer: Federal, State, Local not specified - PPO

## 2019-08-08 ENCOUNTER — Ambulatory Visit (INDEPENDENT_AMBULATORY_CARE_PROVIDER_SITE_OTHER): Payer: Federal, State, Local not specified - PPO | Admitting: Podiatry

## 2019-08-08 DIAGNOSIS — Z9889 Other specified postprocedural states: Secondary | ICD-10-CM

## 2019-08-08 DIAGNOSIS — M2011 Hallux valgus (acquired), right foot: Secondary | ICD-10-CM

## 2019-08-08 DIAGNOSIS — Q666 Other congenital valgus deformities of feet: Secondary | ICD-10-CM | POA: Diagnosis not present

## 2019-08-10 ENCOUNTER — Encounter: Payer: Self-pay | Admitting: Podiatry

## 2019-08-10 NOTE — Progress Notes (Signed)
Subjective:  Patient ID: [name], male    DOB: 10/13/1970,  MRN: [id number]  Chief Complaint  Patient presents with  . Routine Post Op     POV #1 DOS 07/30/19 RT CORRECTION OF BUNION W/AKIN OSTEOTOMY, pt shows no signs of infection, pt is well bandaged.     49 y.o. male returns for post-op check.  Patient states he is doing well.  He has stopped taking his pain medication.  Patient states he does not have any pain.  Patient shows no clinical signs of infection.  He denies any other acute complaints.  He is ambulating as tolerated with a regular cam boot.  Review of Systems: Negative except as noted in the HPI. Denies N/V/F/Ch.  Past Medical History:  Diagnosis Date  . History of kidney stones    Due to dehydration  . Multiple allergies   . Obesity (BMI 30-39.9) 06/12/2019  . PTSD (post-traumatic stress disorder)   . Seizures ([place])    Several years ago  . Sleep apnea     Current Outpatient Medications:  .  cetirizine (ZYRTEC) 10 MG tablet, Take 10 mg by mouth daily., Disp: , Rfl:  .  fexofenadine-pseudoephedrine (ALLEGRA-D 24) 180-240 MG 24 hr tablet, Take 1 tablet by mouth at bedtime., Disp: , Rfl:  .  fluticasone (FLONASE) 50 MCG/ACT nasal spray, Place 1 spray into both nostrils as needed for allergies or rhinitis., Disp: , Rfl:  .  ibuprofen (ADVIL) 800 MG tablet, Take 1 tablet (800 mg total) by mouth every 6 (six) hours as needed., Disp: 60 tablet, Rfl: 1 .  Multiple Vitamins-Minerals (MULTIVITAMIN WITH MINERALS) tablet, Take 1 tablet by mouth daily., Disp: , Rfl:   Social History   Tobacco Use  Smoking Status Current Some Day Smoker  . Types: Cigars  Smokeless Tobacco Never Used  Tobacco Comment   one cigar a week    Allergies  Allergen Reactions  . Other Anaphylaxis  . Pork-Derived Products Anaphylaxis  . Dilantin [Phenytoin] Other (See Comments)    Causes seizure    Objective:  There were no vitals filed for this visit. There is no height or weight on  file to calculate BMI. Constitutional Well developed. Well nourished.  Vascular Foot warm and well perfused. Capillary refill normal to all digits.   Neurologic Normal speech. Oriented to person, place, and time. Epicritic sensation to light touch grossly present bilaterally.  Dermatologic Skin healing well without signs of infection. Skin edges well coapted without signs of infection.  Orthopedic: Tenderness to palpation noted about the surgical site.   Radiographs: 3 views of skeletally mature the right foot: Good correction and alignment noted.  Hardware is intact.  No signs of loosening noted.  No signs of fracture noted. Assessment:   1. Hallux valgus of right foot   2. Pes planovalgus   3. Status post foot surgery    Plan:  Patient was evaluated and treated and all questions answered.  S/p foot surgery right -Progressing as expected post-operatively. -XR: See above -WB Status: Weightbearing as tolerated in cam boot -Sutures: Intact.  No clinical signs of infection noted.  No dehiscence noted. -Medications: None -Foot redressed.  Pes planovalgus I also discussed the bunion and its relationship with pes planus deformity and the importance of orthotics management.  He will be scheduled to see [name] for custom-made orthotics to help address the pes planus to prevent the bunion from getting worse and to control the hindfoot motion as well as support the arch  of the foot. -He will be scheduled see [name] for custom-made orthotics   Return in about 1 week (around 08/15/2019) for Sched with [name] for [employer].

## 2019-08-14 DIAGNOSIS — F4323 Adjustment disorder with mixed anxiety and depressed mood: Secondary | ICD-10-CM | POA: Diagnosis not present

## 2019-08-15 ENCOUNTER — Ambulatory Visit (INDEPENDENT_AMBULATORY_CARE_PROVIDER_SITE_OTHER): Payer: Federal, State, Local not specified - PPO | Admitting: Podiatry

## 2019-08-15 ENCOUNTER — Other Ambulatory Visit: Payer: Self-pay

## 2019-08-15 ENCOUNTER — Encounter: Payer: Self-pay | Admitting: Podiatry

## 2019-08-15 DIAGNOSIS — Z9889 Other specified postprocedural states: Secondary | ICD-10-CM

## 2019-08-15 DIAGNOSIS — M2011 Hallux valgus (acquired), right foot: Secondary | ICD-10-CM

## 2019-08-15 NOTE — Progress Notes (Signed)
Subjective:  Patient ID: [name], male    DOB: Jan 04, 1971,  MRN: [id number]  Chief Complaint  Patient presents with  . Routine Post Op    POV #2 DOS 07/30/19 RT CORRECTION OF BUNION W/AKIN OSTEOTOMY     49 y.o. male returns for post-op check.  Patient states he is doing well.  He has stopped taking his pain medication.  Patient states he does not have any pain.  Patient shows no clinical signs of infection.  He denies any other acute complaints.  He is ambulating as tolerated with a regular cam boot.  Review of Systems: Negative except as noted in the HPI. Denies N/V/F/Ch.  Past Medical History:  Diagnosis Date  . History of kidney stones    Due to dehydration  . Multiple allergies   . Obesity (BMI 30-39.9) 06/12/2019  . PTSD (post-traumatic stress disorder)   . Seizures (HCC)    Several years ago  . Sleep apnea     Current Outpatient Medications:  .  cetirizine (ZYRTEC) 10 MG tablet, Take 10 mg by mouth daily., Disp: , Rfl:  .  citalopram (CELEXA) 20 MG tablet, Take 20 mg by mouth daily., Disp: , Rfl:  .  fexofenadine-pseudoephedrine (ALLEGRA-D 24) 180-240 MG 24 hr tablet, Take 1 tablet by mouth at bedtime., Disp: , Rfl:  .  fluticasone (FLONASE) 50 MCG/ACT nasal spray, Place 1 spray into both nostrils as needed for allergies or rhinitis., Disp: , Rfl:  .  ibuprofen (ADVIL) 800 MG tablet, Take 1 tablet (800 mg total) by mouth every 6 (six) hours as needed., Disp: 60 tablet, Rfl: 1 .  metFORMIN (GLUCOPHAGE) 500 MG tablet, Take 500 mg by mouth daily., Disp: , Rfl:  .  Multiple Vitamins-Minerals (MULTIVITAMIN WITH MINERALS) tablet, Take 1 tablet by mouth daily., Disp: , Rfl:   Social History   Tobacco Use  Smoking Status Current Some Day Smoker  . Types: Cigars  Smokeless Tobacco Never Used  Tobacco Comment   one cigar a week    Allergies  Allergen Reactions  . Other Anaphylaxis  . Pork-Derived Products Anaphylaxis  . Dilantin [Phenytoin] Other (See Comments)     Causes seizure    Objective:  There were no vitals filed for this visit. There is no height or weight on file to calculate BMI. Constitutional Well developed. Well nourished.  Vascular Foot warm and well perfused. Capillary refill normal to all digits.   Neurologic Normal speech. Oriented to person, place, and time. Epicritic sensation to light touch grossly present bilaterally.  Dermatologic Skin healing well without signs of infection. Skin edges well coapted without signs of infection.  Orthopedic: Tenderness to palpation noted about the surgical site.   Radiographs: 3 views of skeletally mature the right foot: Good correction and alignment noted.  Hardware is intact.  No signs of loosening noted.  No signs of fracture noted. Assessment:   1. Hallux valgus of right foot   2. Status post foot surgery    Plan:  Patient was evaluated and treated and all questions answered.  S/p foot surgery right -Progressing as expected post-operatively. -XR: See above -WB Status: Weightbearing as tolerated in cam boot -Sutures: Deep sutures were cut flush to the skin.  No dehiscence noted.  No clinical signs of infection noted. -Medications: None -Foot was redressed with Ace bandage.  Pes planovalgus I also discussed the bunion and its relationship with pes planus deformity and the importance of orthotics management.  He will be scheduled to see  [name] for custom-made orthotics to help address the pes planus to prevent the bunion from getting worse and to control the hindfoot motion as well as support the arch of the foot. -He will be scheduled see [name] for custom-made orthotics   No follow-ups on file.

## 2019-08-16 ENCOUNTER — Ambulatory Visit (INDEPENDENT_AMBULATORY_CARE_PROVIDER_SITE_OTHER): Payer: Federal, State, Local not specified - PPO | Admitting: Orthotics

## 2019-08-16 DIAGNOSIS — Q666 Other congenital valgus deformities of feet: Secondary | ICD-10-CM | POA: Diagnosis not present

## 2019-08-16 DIAGNOSIS — Z9889 Other specified postprocedural states: Secondary | ICD-10-CM

## 2019-08-16 DIAGNOSIS — M2011 Hallux valgus (acquired), right foot: Secondary | ICD-10-CM

## 2019-08-16 NOTE — Progress Notes (Signed)
Cast today for CMFO to address b/l pes planus and h/0 sx RT foot bunionectomy.  Plan on arch support as well as forefoot cushion, met pad.

## 2019-08-20 DIAGNOSIS — F064 Anxiety disorder due to known physiological condition: Secondary | ICD-10-CM | POA: Diagnosis not present

## 2019-08-20 DIAGNOSIS — R739 Hyperglycemia, unspecified: Secondary | ICD-10-CM | POA: Diagnosis not present

## 2019-08-20 DIAGNOSIS — I10 Essential (primary) hypertension: Secondary | ICD-10-CM | POA: Diagnosis not present

## 2019-08-28 DIAGNOSIS — F4323 Adjustment disorder with mixed anxiety and depressed mood: Secondary | ICD-10-CM | POA: Diagnosis not present

## 2019-08-29 ENCOUNTER — Ambulatory Visit (INDEPENDENT_AMBULATORY_CARE_PROVIDER_SITE_OTHER): Payer: Federal, State, Local not specified - PPO | Admitting: Podiatry

## 2019-08-29 ENCOUNTER — Other Ambulatory Visit: Payer: Self-pay

## 2019-08-29 ENCOUNTER — Ambulatory Visit (INDEPENDENT_AMBULATORY_CARE_PROVIDER_SITE_OTHER): Payer: Federal, State, Local not specified - PPO

## 2019-08-29 DIAGNOSIS — M2011 Hallux valgus (acquired), right foot: Secondary | ICD-10-CM

## 2019-08-29 DIAGNOSIS — Z9889 Other specified postprocedural states: Secondary | ICD-10-CM

## 2019-08-29 NOTE — Progress Notes (Signed)
Subjective:  Patient ID: [name], male    DOB: December 16, 1970,  MRN: [id number]  Chief Complaint  Patient presents with  . Routine Post Op    POV #3 DOS 07/30/19 RT CORRECTION OF BUNION W/AKIN OSTEOTOMY    49 y.o. male returns for post-op check.  Patient is doing really well.  He has completely healed.  Incision site has completely healed.  Is been able to ambulate with regular sneakers without acute complaints.  He denies any nausea fever chills vomiting.  No clinical signs of infection.  Review of Systems: Negative except as noted in the HPI. Denies N/V/F/Ch.  Past Medical History:  Diagnosis Date  . History of kidney stones    Due to dehydration  . Multiple allergies   . Obesity (BMI 30-39.9) 06/12/2019  . PTSD (post-traumatic stress disorder)   . Seizures (HCC)    Several years ago  . Sleep apnea     Current Outpatient Medications:  .  cetirizine (ZYRTEC) 10 MG tablet, Take 10 mg by mouth daily., Disp: , Rfl:  .  citalopram (CELEXA) 20 MG tablet, Take 20 mg by mouth daily., Disp: , Rfl:  .  fexofenadine-pseudoephedrine (ALLEGRA-D 24) 180-240 MG 24 hr tablet, Take 1 tablet by mouth at bedtime., Disp: , Rfl:  .  fluticasone (FLONASE) 50 MCG/ACT nasal spray, Place 1 spray into both nostrils as needed for allergies or rhinitis., Disp: , Rfl:  .  ibuprofen (ADVIL) 800 MG tablet, Take 1 tablet (800 mg total) by mouth every 6 (six) hours as needed., Disp: 60 tablet, Rfl: 1 .  metFORMIN (GLUCOPHAGE) 500 MG tablet, Take 500 mg by mouth daily., Disp: , Rfl:  .  Multiple Vitamins-Minerals (MULTIVITAMIN WITH MINERALS) tablet, Take 1 tablet by mouth daily., Disp: , Rfl:   Social History   Tobacco Use  Smoking Status Current Some Day Smoker  . Types: Cigars  Smokeless Tobacco Never Used  Tobacco Comment   one cigar a week    Allergies  Allergen Reactions  . Other Anaphylaxis  . Pork-Derived Products Anaphylaxis  . Dilantin [Phenytoin] Other (See Comments)    Causes seizure      Objective:  There were no vitals filed for this visit. There is no height or weight on file to calculate BMI. Constitutional Well developed. Well nourished.  Vascular Foot warm and well perfused. Capillary refill normal to all digits.   Neurologic Normal speech. Oriented to person, place, and time. Epicritic sensation to light touch grossly present bilaterally.  Dermatologic  skin completely epithelialized without any clinical signs of infection.  Orthopedic:  No tenderness to palpation noted about the surgical site.  Good range of motion noted at the metatarsophalangeal joint.  No intra-articular pain noted.  Good extensor and flexor strength noted 5 out of 5.  Good correction alignment noted.   Radiographs: 3 views of skeletally mature adult right foot: Good correction alignment noted.  No osseous abnormality noted.  Hardware is intact no signs of loosening or backing out. Assessment:   1. Hallux valgus of right foot   2. Status post foot surgery    Plan:  Patient was evaluated and treated and all questions answered.  S/p foot surgery right -Progressing as expected post-operatively. -XR: See above -WB Status: Patient can transition to weightbearing as tolerated in regular sneakers. -Sutures: None. -Medications: None -Given clinically significant improvement in the range of motion of the right foot as well as no pain associated with it patient can transition to regular sneakers.  He is officially discharged from my care if any foot and ankle issues arises in the future or if the surgical sites bother him he can return to see me again.  Patient states understanding.  Return if symptoms worsen or fail to improve, for Do google review and t shirt .

## 2019-08-30 ENCOUNTER — Encounter: Payer: Self-pay | Admitting: Podiatry

## 2019-09-04 DIAGNOSIS — F4323 Adjustment disorder with mixed anxiety and depressed mood: Secondary | ICD-10-CM | POA: Diagnosis not present

## 2019-09-11 ENCOUNTER — Ambulatory Visit: Payer: Federal, State, Local not specified - PPO | Admitting: Orthotics

## 2019-09-11 ENCOUNTER — Other Ambulatory Visit: Payer: Self-pay

## 2019-09-11 DIAGNOSIS — Z9889 Other specified postprocedural states: Secondary | ICD-10-CM

## 2019-09-11 DIAGNOSIS — F4323 Adjustment disorder with mixed anxiety and depressed mood: Secondary | ICD-10-CM | POA: Diagnosis not present

## 2019-09-11 DIAGNOSIS — M2011 Hallux valgus (acquired), right foot: Secondary | ICD-10-CM

## 2019-09-11 NOTE — Progress Notes (Signed)
Patient came in today to pick up custom made foot orthotics.  The goals were accomplished and the patient reported no dissatisfaction with said orthotics.  Patient was advised of breakin period and how to report any issues. 

## 2019-09-18 DIAGNOSIS — F4323 Adjustment disorder with mixed anxiety and depressed mood: Secondary | ICD-10-CM | POA: Diagnosis not present

## 2019-09-19 NOTE — Progress Notes (Signed)
Dos 07/30/19 Right [place] Bunionectomy with correction Bunion by phalanx osteotomy

## 2019-09-21 ENCOUNTER — Ambulatory Visit (HOSPITAL_BASED_OUTPATIENT_CLINIC_OR_DEPARTMENT_OTHER): Payer: Federal, State, Local not specified - PPO | Admitting: Cardiology

## 2019-09-25 ENCOUNTER — Other Ambulatory Visit: Payer: Self-pay

## 2019-09-25 ENCOUNTER — Ambulatory Visit (HOSPITAL_BASED_OUTPATIENT_CLINIC_OR_DEPARTMENT_OTHER): Payer: Federal, State, Local not specified - PPO | Attending: Cardiology | Admitting: Cardiology

## 2019-09-25 DIAGNOSIS — R0902 Hypoxemia: Secondary | ICD-10-CM | POA: Diagnosis not present

## 2019-09-25 DIAGNOSIS — G4733 Obstructive sleep apnea (adult) (pediatric): Secondary | ICD-10-CM | POA: Insufficient documentation

## 2019-09-26 ENCOUNTER — Telehealth: Payer: Self-pay | Admitting: *Deleted

## 2019-09-26 ENCOUNTER — Encounter: Payer: Self-pay | Admitting: *Deleted

## 2019-09-26 NOTE — Telephone Encounter (Deleted)
-----   Message from Quintella Reichert, MD sent at 09/26/2019  2:39 PM EDT ----- Please let patient know that they have sleep apnea and recommend CPAP titration. Please set up titration in the sleep lab.

## 2019-09-26 NOTE — Procedures (Signed)
   Patient Name: [name], [name] Date: 09/25/2019 Gender: Male D.O.B: Jun 28, 1970 Age (years): 49 Referring Provider: Armanda Magic MD, ABSM Height (inches): 73 Interpreting Physician: Armanda Magic MD, ABSM Weight (lbs): 240 RPSGT: [place] BMI: 32 MRN: [id number] Neck Size: 16.50  CLINICAL INFORMATION Sleep Study Type: HST  Indication for sleep study: OSA  Epworth Sleepiness Score: 12  Most recent polysomnogram dated 11/22/2017 revealed an AHI of 45.1/h and RDI of 45.1/h. Most recent titration study dated 05/14/2019 revealed an AHI of 43.1/h.  S/P hypoglossal Nerve stimulator   SLEEP STUDY TECHNIQUE A multi-channel overnight portable sleep study was performed. The channels recorded were: nasal airflow, thoracic respiratory movement, and oxygen saturation with a pulse oximetry. Snoring was also monitored.  MEDICATIONS Patient self administered medications include: N/A.  SLEEP ARCHITECTURE Patient was studied for 438.4 minutes. The sleep efficiency was 100.0 % and the patient was supine for 99.1%. The arousal index was 0.0 per hour.  RESPIRATORY PARAMETERS The overall AHI was 44.8 per hour, with a central apnea index of 0.0 per hour.  The oxygen nadir was 68% during sleep.  CARDIAC DATA Mean heart rate during sleep was 75.8 bpm.  IMPRESSIONS - Severe obstructive sleep apnea occurred during this study (AHI = 44.8/h). - No significant central sleep apnea occurred during this study (CAI = 0.0/h). - Severe oxygen desaturation was noted during this study (Min O2 = 68%). - Patient snored 27.7% during the sleep.  DIAGNOSIS - Obstructive Sleep Apnea (G47.33) - Nocturnal Hypoxemia (G47.36)  RECOMMENDATIONS - Positional therapy avoiding supine position during sleep. - Avoid alcohol, sedatives and other CNS depressants that may worsen sleep apnea and disrupt normal sleep architecture. - Sleep hygiene should be reviewed to assess factors that may improve sleep  quality. - Weight management and regular exercise should be initiated or continued. - return to office for evaluation with [employer] rep  [Electronically signed] 09/26/2019 02:37 PM  Armanda Magic MD, ABSM Diplomate, American Board of Sleep Medicine

## 2019-09-26 NOTE — Telephone Encounter (Signed)
Notified Madelyn Flavors the [employer] Rep of the patients sleep study results via e-mail. He is out of town until 10/02/19 and will address it then.

## 2019-09-26 NOTE — Telephone Encounter (Signed)
  Turner, Traci R, MD  Hadassah Rana G, CMA        Please let patient know that they have sleep apnea and recommend CPAP titration. Please set up titration in the sleep lab.      

## 2019-09-26 NOTE — Telephone Encounter (Deleted)
Informed patient of sleep study results and patient understanding was verbalized. Patient understandshissleep study showed they have sleep apnea and recommend CPAP titration. Please set up titration in the sleep lab. Titration sent to sleep pool. Pt is aware and agreeable tohisresults. 

## 2019-09-26 NOTE — Telephone Encounter (Signed)
-----   Message from Quintella Reichert, MD sent at 09/26/2019  2:41 PM EDT ----- Disregard noted for PCAP titration he has an [employer] - did not do well on home sleep study still with a lot of apnea.  Please notify [employer] rep of sleep study results

## 2019-09-26 NOTE — Telephone Encounter (Signed)
This encounter was created in error - please disregard.

## 2019-10-01 DIAGNOSIS — I10 Essential (primary) hypertension: Secondary | ICD-10-CM | POA: Diagnosis not present

## 2019-10-01 DIAGNOSIS — E1165 Type 2 diabetes mellitus with hyperglycemia: Secondary | ICD-10-CM | POA: Diagnosis not present

## 2019-10-01 DIAGNOSIS — R635 Abnormal weight gain: Secondary | ICD-10-CM | POA: Diagnosis not present

## 2019-10-01 DIAGNOSIS — F064 Anxiety disorder due to known physiological condition: Secondary | ICD-10-CM | POA: Diagnosis not present

## 2019-10-02 DIAGNOSIS — F4323 Adjustment disorder with mixed anxiety and depressed mood: Secondary | ICD-10-CM | POA: Diagnosis not present

## 2019-10-09 DIAGNOSIS — F4323 Adjustment disorder with mixed anxiety and depressed mood: Secondary | ICD-10-CM | POA: Diagnosis not present

## 2019-10-16 DIAGNOSIS — F4323 Adjustment disorder with mixed anxiety and depressed mood: Secondary | ICD-10-CM | POA: Diagnosis not present

## 2019-10-23 DIAGNOSIS — F4323 Adjustment disorder with mixed anxiety and depressed mood: Secondary | ICD-10-CM | POA: Diagnosis not present

## 2019-10-25 ENCOUNTER — Telehealth: Payer: Self-pay

## 2019-10-25 ENCOUNTER — Ambulatory Visit: Payer: Federal, State, Local not specified - PPO | Admitting: Cardiology

## 2019-10-25 ENCOUNTER — Encounter: Payer: Self-pay | Admitting: Cardiology

## 2019-10-25 ENCOUNTER — Other Ambulatory Visit: Payer: Self-pay

## 2019-10-25 VITALS — BP 141/96 | HR 94 | Ht 73.0 in | Wt 236.6 lb

## 2019-10-25 DIAGNOSIS — E669 Obesity, unspecified: Secondary | ICD-10-CM | POA: Diagnosis not present

## 2019-10-25 DIAGNOSIS — G4733 Obstructive sleep apnea (adult) (pediatric): Secondary | ICD-10-CM

## 2019-10-25 NOTE — Telephone Encounter (Signed)
  Patient Consent for Virtual Visit         [name] has provided verbal consent on 10/25/2019 for a virtual visit (video or telephone).   CONSENT FOR VIRTUAL VISIT FOR:  [name]  By participating in this virtual visit I agree to the following:  I hereby voluntarily request, consent and authorize [facility] and its employed or contracted physicians, physician assistants, nurse practitioners or other licensed health care professionals (the Practitioner), to provide me with telemedicine health care services (the "Services") as deemed necessary by the treating Practitioner. I acknowledge and consent to receive the Services by the Practitioner via telemedicine. I understand that the telemedicine visit will involve communicating with the Practitioner through live audiovisual communication technology and the disclosure of certain medical information by electronic transmission. I acknowledge that I have been given the opportunity to request an in-person assessment or other available alternative prior to the telemedicine visit and am voluntarily participating in the telemedicine visit.  I understand that I have the right to withhold or withdraw my consent to the use of telemedicine in the course of my care at any time, without affecting my right to future care or treatment, and that the Practitioner or I may terminate the telemedicine visit at any time. I understand that I have the right to inspect all information obtained and/or recorded in the course of the telemedicine visit and may receive copies of available information for a reasonable fee.  I understand that some of the potential risks of receiving the Services via telemedicine include:  Marland Kitchen Delay or interruption in medical evaluation due to technological equipment failure or disruption; . Information transmitted may not be sufficient (e.g. poor resolution of images) to allow for appropriate medical decision making by the Practitioner;  and/or  . In rare instances, security protocols could fail, causing a breach of personal health information.  Furthermore, I acknowledge that it is my responsibility to provide information about my medical history, conditions and care that is complete and accurate to the best of my ability. I acknowledge that Practitioner's advice, recommendations, and/or decision may be based on factors not within their control, such as incomplete or inaccurate data provided by me or distortions of diagnostic images or specimens that may result from electronic transmissions. I understand that the practice of medicine is not an exact science and that Practitioner makes no warranties or guarantees regarding treatment outcomes. I acknowledge that a copy of this consent can be made available to me via my patient portal [facility] MyChart), or I can request a printed copy by calling the office of [facility].    I understand that my insurance will be billed for this visit.   I have read or had this consent read to me. . I understand the contents of this consent, which adequately explains the benefits and risks of the Services being provided via telemedicine.  . I have been provided ample opportunity to ask questions regarding this consent and the Services and have had my questions answered to my satisfaction. . I give my informed consent for the services to be provided through the use of telemedicine in my medical care

## 2019-10-25 NOTE — Patient Instructions (Signed)
Medication Instructions:  Your physician recommends that you continue on your current medications as directed. Please refer to the Current Medication list given to you today.  *If you need a refill on your cardiac medications before your next appointment, please call your pharmacy*  Follow-Up:  At [facility], you and your health needs are our priority.  As part of our continuing mission to provide you with exceptional heart care, we have created designated Provider Care Teams.  These Care Teams include your primary Cardiologist (physician) and Advanced Practice Providers (APPs -  Physician Assistants and Nurse Practitioners) who all work together to provide you with the care you need, when you need it.  Your next appointment:   6 week(s)  The format for your next appointment:   Virtual Visit   Provider:   Traci Turner, MD  

## 2019-10-25 NOTE — Progress Notes (Signed)
Date:  10/25/2019   ID:  [name], DOB [birth year], MRN [id number]  Patient Location:  Home  Provider location:   [place]  PCP:  Renaye Rakers, MD  Sleep medicine:  Armanda Magic, MD Electrophysiologist:  None   Chief Complaint:  OSA  History of Present Illness:     [name] is a 49 y.o. male with a hx of PTSD and seizure disorder who was referred to Dr. Jenne Pane with ENT for evaluation of OSA. He was apparently diagnosed with  Severe OSA with an AHI of 45.1/hr. He tried oral appliances and CPAP but had difficulty tolerating these due to prior PTSD from his [employer]. He hashadsevere excessive daytime sleepiness and very poor sleep at night only sleeping 4 hours if he is lucky. He has severe snoring and has to sleep in another room. He drinks coffee throughout the day to stay awake. He also has severe allergies which make it very difficult for him to breath at night with the mask on despite taking meds for allergies.He also had significant allergies that contributed as well.   Due to sx of excessive daytime sleepiness, he was referred to Dr. Jenne Pane for evaluation of the [facility] device. He underwent repeat home sleep study showing severe OSA with an AHI of 45.1/hr with no central apnea. His Epworth sleepiness score was 12. Oxygen saturations dropped as low as 65%. He underwent drug induced sleep endoscopy on 01/27/2018 and was felt to be a good candidate for hypoglossal nerve stimulator.He underwent hypoglossal nerve stimulator implant on 12/27/2018 and was seen back in the office 02/06/2019 for activation of his device.  04/03/2019:  Seen in office for followup after activation of his device and was having problems with reduced effectiveness and increased sleepiness after rapid acclimation of settings and reached the top level on his functional range and settings were adjusted.    05/14/2019:  In lab sleep study showing severe O2 drop to 76% with loud snoring and  AHI of 65/hr.    06/12/2019:  In office for setting adjustments  09/26/2019:  Home sleep study showed severe OSA with AHI 44.8/hr and nocturnal hypoxemia with O2 sats 68%.  He is now back today for adjustments to his device due to significant ongoing OSA and hypoxemia. Recent home sleep study showed severe OSA with an AHI of 44.8/hr and nocturnal hypoxemia with O2 sats as low as 68%. He has not been using his device a lot over the past month.     Prior CV studies:   The following studies were reviewed today:  Home sleep study  Past Medical History:  Diagnosis Date  . History of kidney stones    Due to dehydration  . Multiple allergies   . Obesity (BMI 30-39.9) 06/12/2019  . PTSD (post-traumatic stress disorder)   . Seizures (HCC)    Several years ago  . Sleep apnea    Past Surgical History:  Procedure Laterality Date  . Bones reset     [employer]  . DRUG INDUCED ENDOSCOPY N/A 01/27/2018   Procedure: DRUG INDUCED ENDOSCOPY;  Surgeon: Christia Reading, MD;  Location: [facility];  Service: ENT;  Laterality: N/A;  . IMPLANTATION OF HYPOGLOSSAL NERVE STIMULATOR N/A 12/27/2018   Procedure: IMPLANTATION OF HYPOGLOSSAL NERVE STIMULATOR;  Surgeon: Christia Reading, MD;  Location: [facility] OR;  Service: ENT;  Laterality: N/A;  . Removal of Shrapnel     [employer]     No outpatient medications have been marked as taking for  the 10/25/19 encounter (Appointment) with Quintella Reichert, MD.     Allergies:   Other, Pork-derived products, and Dilantin [phenytoin]   Social History   Tobacco Use  . Smoking status: Current Some Day Smoker    Types: Cigars  . Smokeless tobacco: Never Used  . Tobacco comment: one cigar a week  Vaping Use  . Vaping Use: Never used  Substance Use Topics  . Alcohol use: Not Currently  . Drug use: No     Family Hx: The patient's family history is not on file.  ROS:   Please see the history of present illness.     All other systems reviewed and are  negative.   Labs/Other Tests and Data Reviewed:    Recent Labs: 06/29/2019: BUN 8; Creatinine, Ser 1.04; Hemoglobin 15.3; Platelets 253; Potassium 4.0; Sodium 137   Recent Lipid Panel No results found for: CHOL, TRIG, HDL, CHOLHDL, LDLCALC, LDLDIRECT  Wt Readings from Last 3 Encounters:  09/25/19 235 lb (106.6 kg)  07/13/19 242 lb (109.8 kg)  06/29/19 240 lb (108.9 kg)     Objective:    Vital Signs:  There were no vitals taken for this visit.   GEN: Well nourished, well developed in no acute distress HEENT: Normal NECK: No JVD; No carotid bruits LYMPHATICS: No lymphadenopathy CARDIAC:RRR, no murmurs, rubs, gallops RESPIRATORY:  Clear to auscultation without rales, wheezing or rhonchi  ABDOMEN: Soft, non-tender, non-distended MUSCULOSKELETAL:  No edema; No deformity  SKIN: Warm and dry NEUROLOGIC:  Alert and oriented x 3 PSYCHIATRIC:  Normal affect    ASSESSMENT & PLAN:    1.  OSA -intolerant to CPAP therapy due to severe claustrophobia and PTSD -s/p [employer] device -s/p sleep study last month with continued severe OSA with nocturnal hypoxemia -device reprogrammed at last OV -s/p home sleep study with severe OSA and AHI >40/hr with nocturnal hypoxemia.  The night he did the test he was pausing his device quite a bit which could result in higher apneas when device was not working -recommend awake endoscopy for further evaluation with Dr. Jenne Pane since we have reprogrammed the device several times without improvement in apnea.  -followup with me 4 weeks after the endoscopy -he has had some problems with reoccurrence with PTSD and now is on Celexa for this and has 2 service dogs.  They have been sleeping with him and sometimes wake him up and he pauses his device.  The PTSD has interferred some with him using his device.  -he will see me back 4 weeks after endo and if doing ok will schedule in lab study  2.  Obesity -I have encouraged him to get into a routine exercise program  and cut back on carbs and portions.   Medication Adjustments/Labs and Tests Ordered: Current medicines are reviewed at length with the patient today.  Concerns regarding medicines are outlined above.  Tests Ordered: No orders of the defined types were placed in this encounter.  Medication Changes: No orders of the defined types were placed in this encounter.   Disposition:  Follow up in 6 month(s)  Signed, Armanda Magic, MD  10/25/2019 12:36 PM    [facility]

## 2019-10-30 DIAGNOSIS — F4323 Adjustment disorder with mixed anxiety and depressed mood: Secondary | ICD-10-CM | POA: Diagnosis not present

## 2019-11-06 DIAGNOSIS — F4323 Adjustment disorder with mixed anxiety and depressed mood: Secondary | ICD-10-CM | POA: Diagnosis not present

## 2019-11-14 DIAGNOSIS — G4733 Obstructive sleep apnea (adult) (pediatric): Secondary | ICD-10-CM | POA: Diagnosis not present

## 2019-11-14 DIAGNOSIS — Z6831 Body mass index (BMI) 31.0-31.9, adult: Secondary | ICD-10-CM | POA: Diagnosis not present

## 2019-11-20 DIAGNOSIS — F4323 Adjustment disorder with mixed anxiety and depressed mood: Secondary | ICD-10-CM | POA: Diagnosis not present

## 2019-11-23 ENCOUNTER — Telehealth: Payer: Self-pay

## 2019-11-23 NOTE — Telephone Encounter (Signed)
Patient will be moving to [place], [place] in about a month. He will need to be set up with a new sleep provider. [employer] rep has made the recommendation for the patient to see Dr. Tonie Griffith with the [facility] Sleep Department. I have informed the patient. He will need his medical records sent to them.

## 2019-11-23 NOTE — Telephone Encounter (Signed)
Formatting of this note might be different from the original.  Patient will be moving to [place], [place] in about a month. He will need to be set up with a new sleep provider. [employer] rep has made the recommendation for the patient to see Dr. Madelon Lips with the [facility] Sleep Department. I have informed the patient. He will need his medical records sent to them.   Electronically signed by Antonieta Iba, RN at 11/23/2019 11:40 AM EDT

## 2019-11-27 DIAGNOSIS — F4323 Adjustment disorder with mixed anxiety and depressed mood: Secondary | ICD-10-CM | POA: Diagnosis not present

## 2019-12-05 DIAGNOSIS — F4323 Adjustment disorder with mixed anxiety and depressed mood: Secondary | ICD-10-CM | POA: Diagnosis not present

## 2019-12-12 ENCOUNTER — Telehealth: Payer: Federal, State, Local not specified - PPO | Admitting: Cardiology

## 2021-01-04 IMAGING — DX DG NECK SOFT TISSUE
1 series · 1 of 1 positions shown · non-contrast
Comparison: Concurrent portal AP view of the chest. No prior
studies.

CLINICAL DATA: Hypoglossal nerve stimulator placement.

EXAM:
NECK SOFT TISSUES - 1+ VIEW

[neck lat]
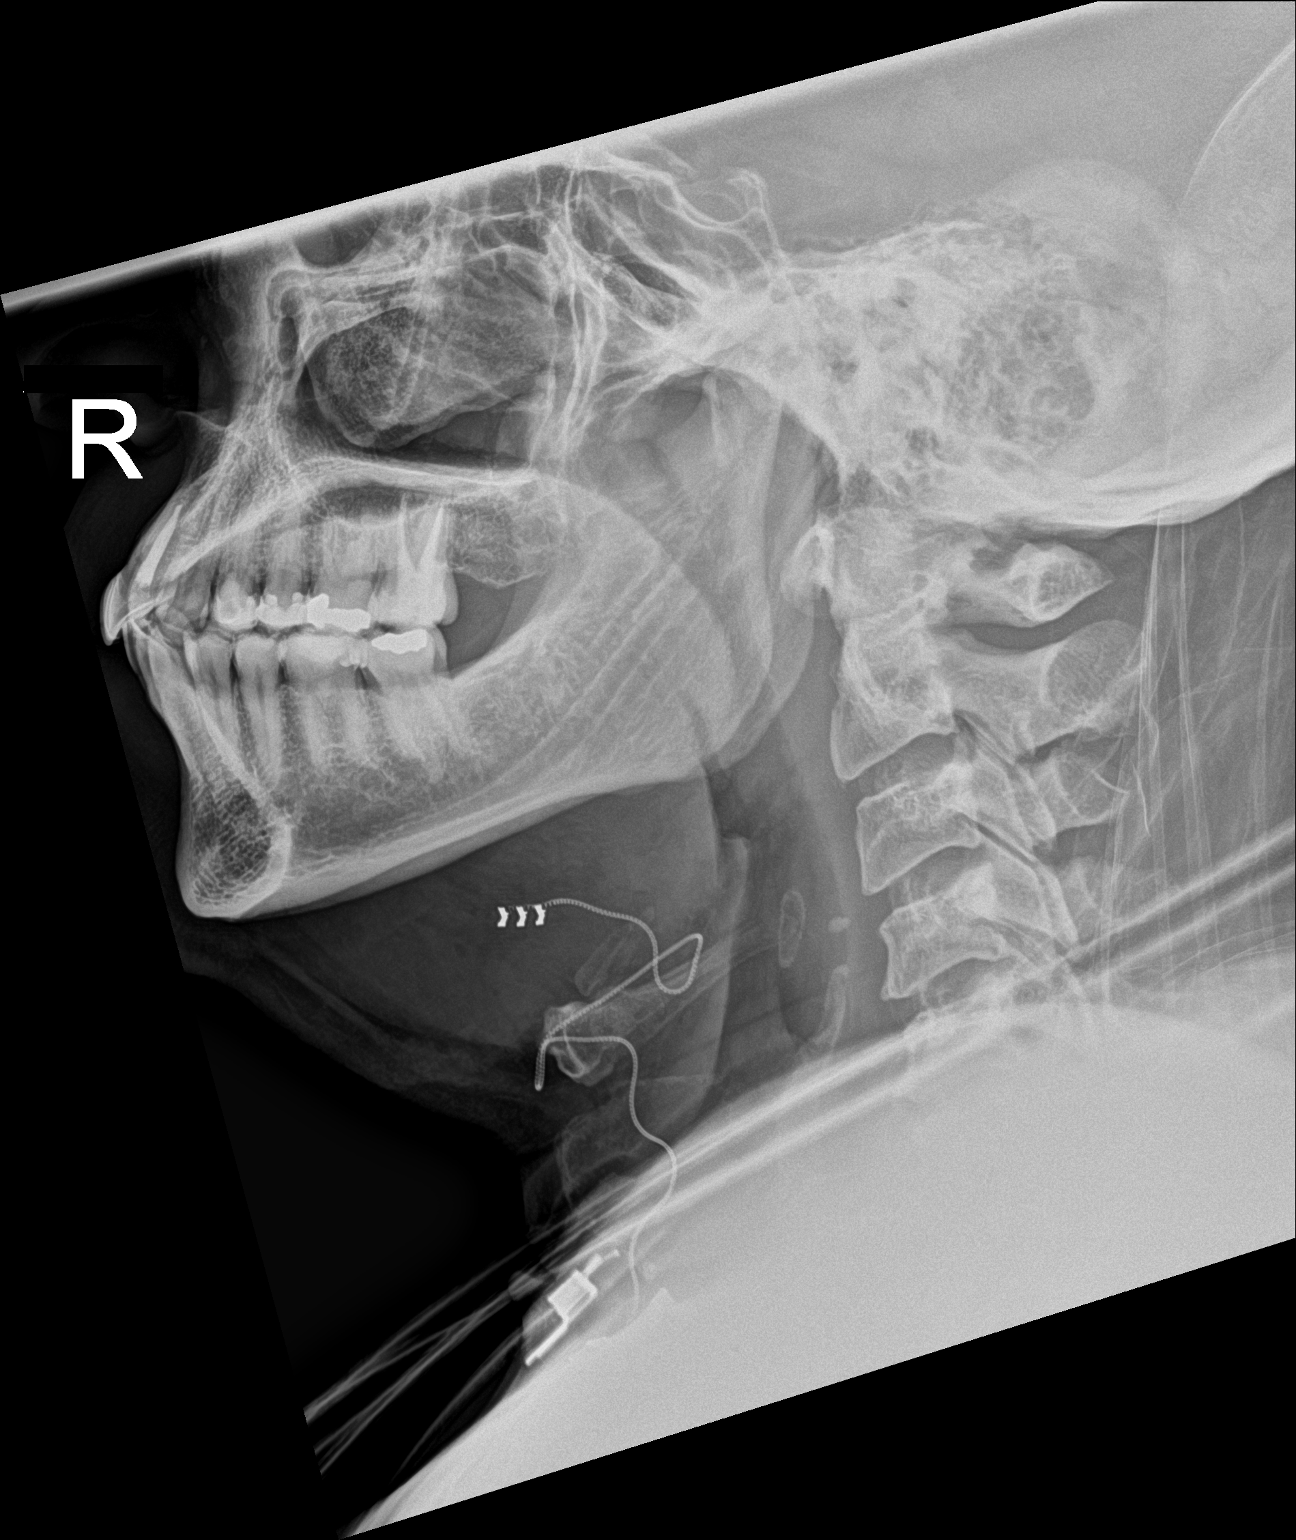

[1 of 1 positions shown; findings below may reference images not displayed]

FINDINGS: Single lateral view of the neck. Metallic lead extends into the
hypoglossal region, tip above the hyoid bone. No prevertebral soft
tissue swelling or swelling of the epiglottis. The bones appear
unremarkable.
IMPRESSION: Metallic lead extends into the hypoglossal region.

## 2021-01-04 IMAGING — DX DG CHEST 1V
1 series · 1 of 1 positions shown · non-contrast
Comparison: None.

CLINICAL DATA: Hypoglossal nerve stimulator placement.

EXAM:
CHEST  1 VIEW

[chest ap]
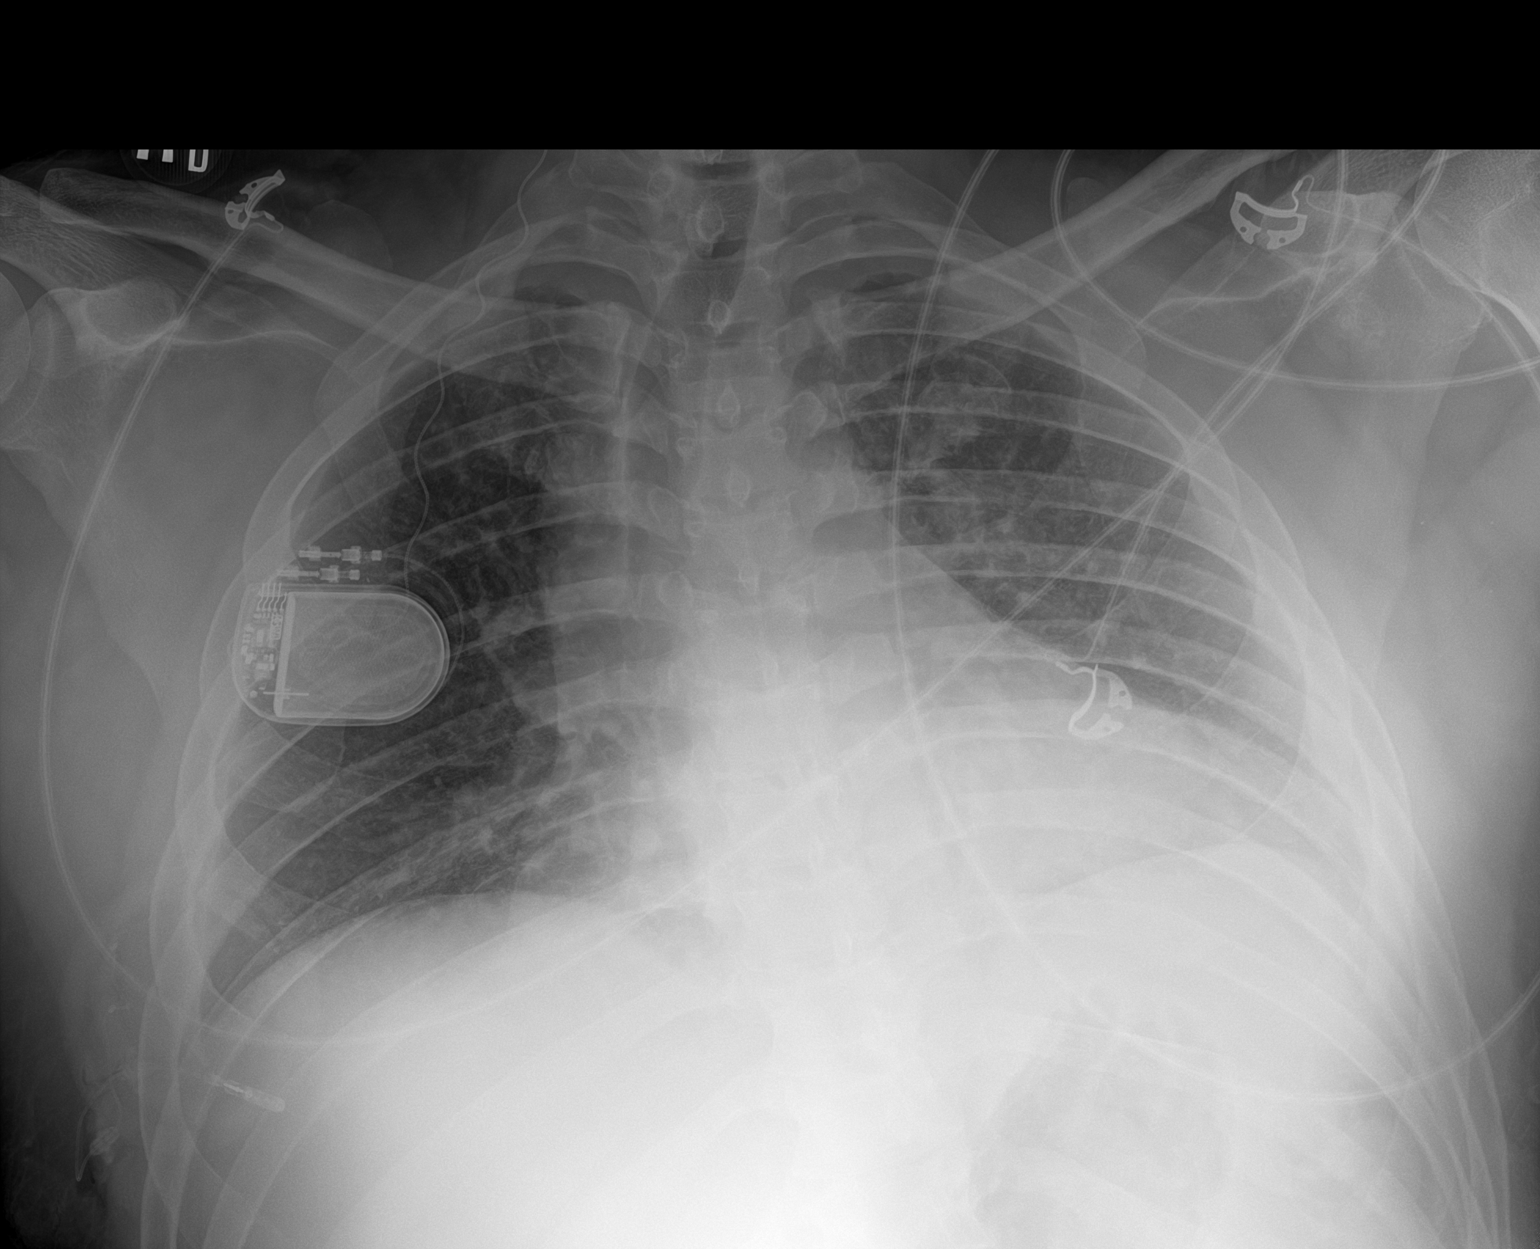

[1 of 1 positions shown; findings below may reference images not displayed]

FINDINGS: 7627 hours. Generator pack overlies the right chest with a lead
extending superiorly into the neck. There are low lung volumes with
bibasilar atelectasis. There may be a small left pleural effusion.
No confluent airspace opacity or pneumothorax. The heart size and
mediastinal contours are normal for technique.
IMPRESSION: Bibasilar atelectasis and possible small left pleural effusion.
Generator pack overlies the right chest with a lead extending
superiorly into the neck.

## 2021-02-11 ENCOUNTER — Encounter

## 2021-02-19 ENCOUNTER — Inpatient Hospital Stay: Payer: BLUE CROSS/BLUE SHIELD | Attending: Specialist | Primary: Family Medicine

## 2021-02-19 DIAGNOSIS — M5416 Radiculopathy, lumbar region: Secondary | ICD-10-CM

## 2021-03-05 ENCOUNTER — Inpatient Hospital Stay: Admit: 2021-03-05 | Payer: BLUE CROSS/BLUE SHIELD | Attending: Specialist | Primary: Family Medicine

## 2021-03-05 ENCOUNTER — Inpatient Hospital Stay: Attending: Specialist | Primary: Family Medicine

## 2021-03-05 ENCOUNTER — Encounter

## 2021-03-05 DIAGNOSIS — Z1389 Encounter for screening for other disorder: Secondary | ICD-10-CM

## 2021-07-07 IMAGING — DX DG CHEST 2V
2 series · 2 of 2 positions shown · non-contrast
Comparison: 12/27/2018

CLINICAL DATA: Chest pain.

EXAM:
CHEST - 2 VIEW

[w chest pa]
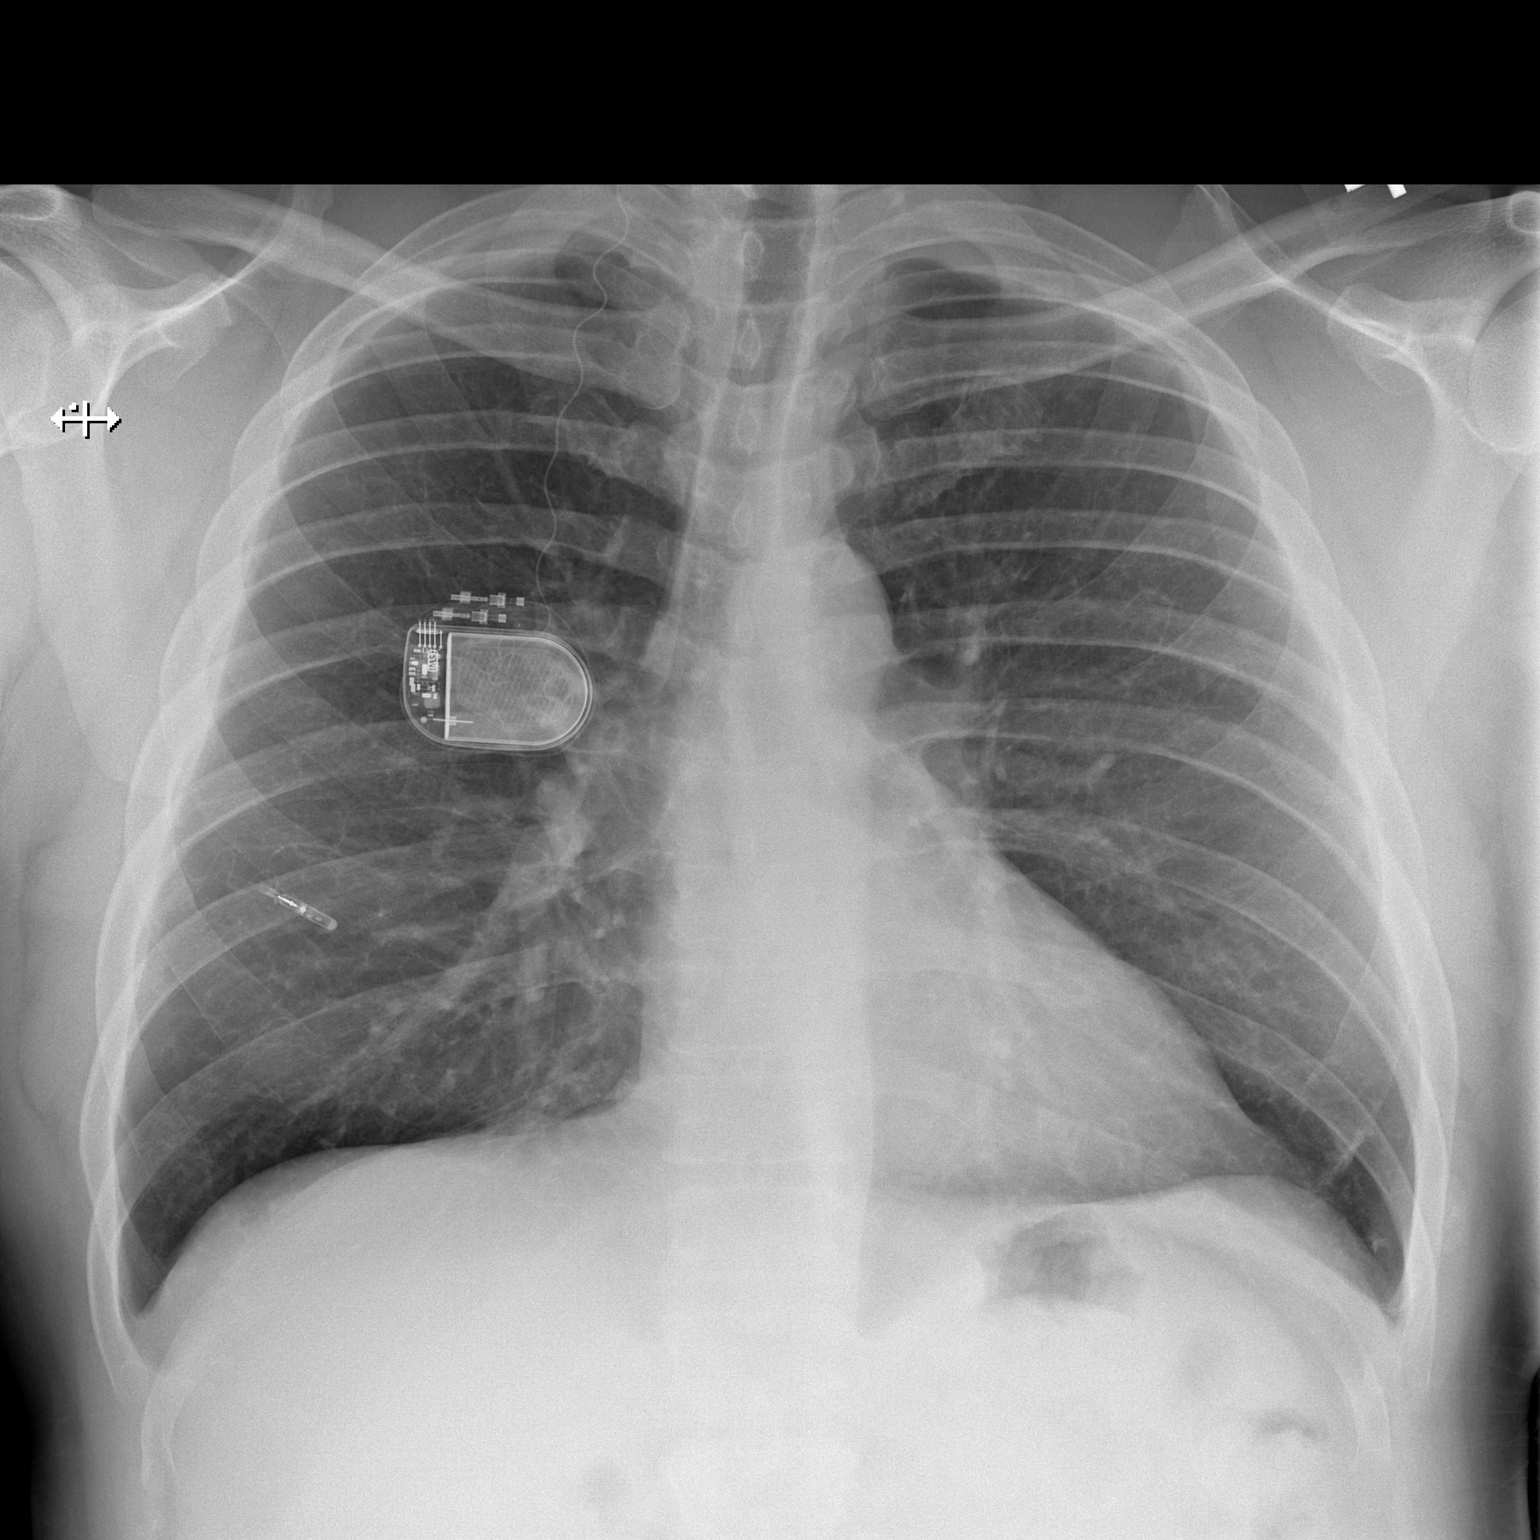

[w chest lat]
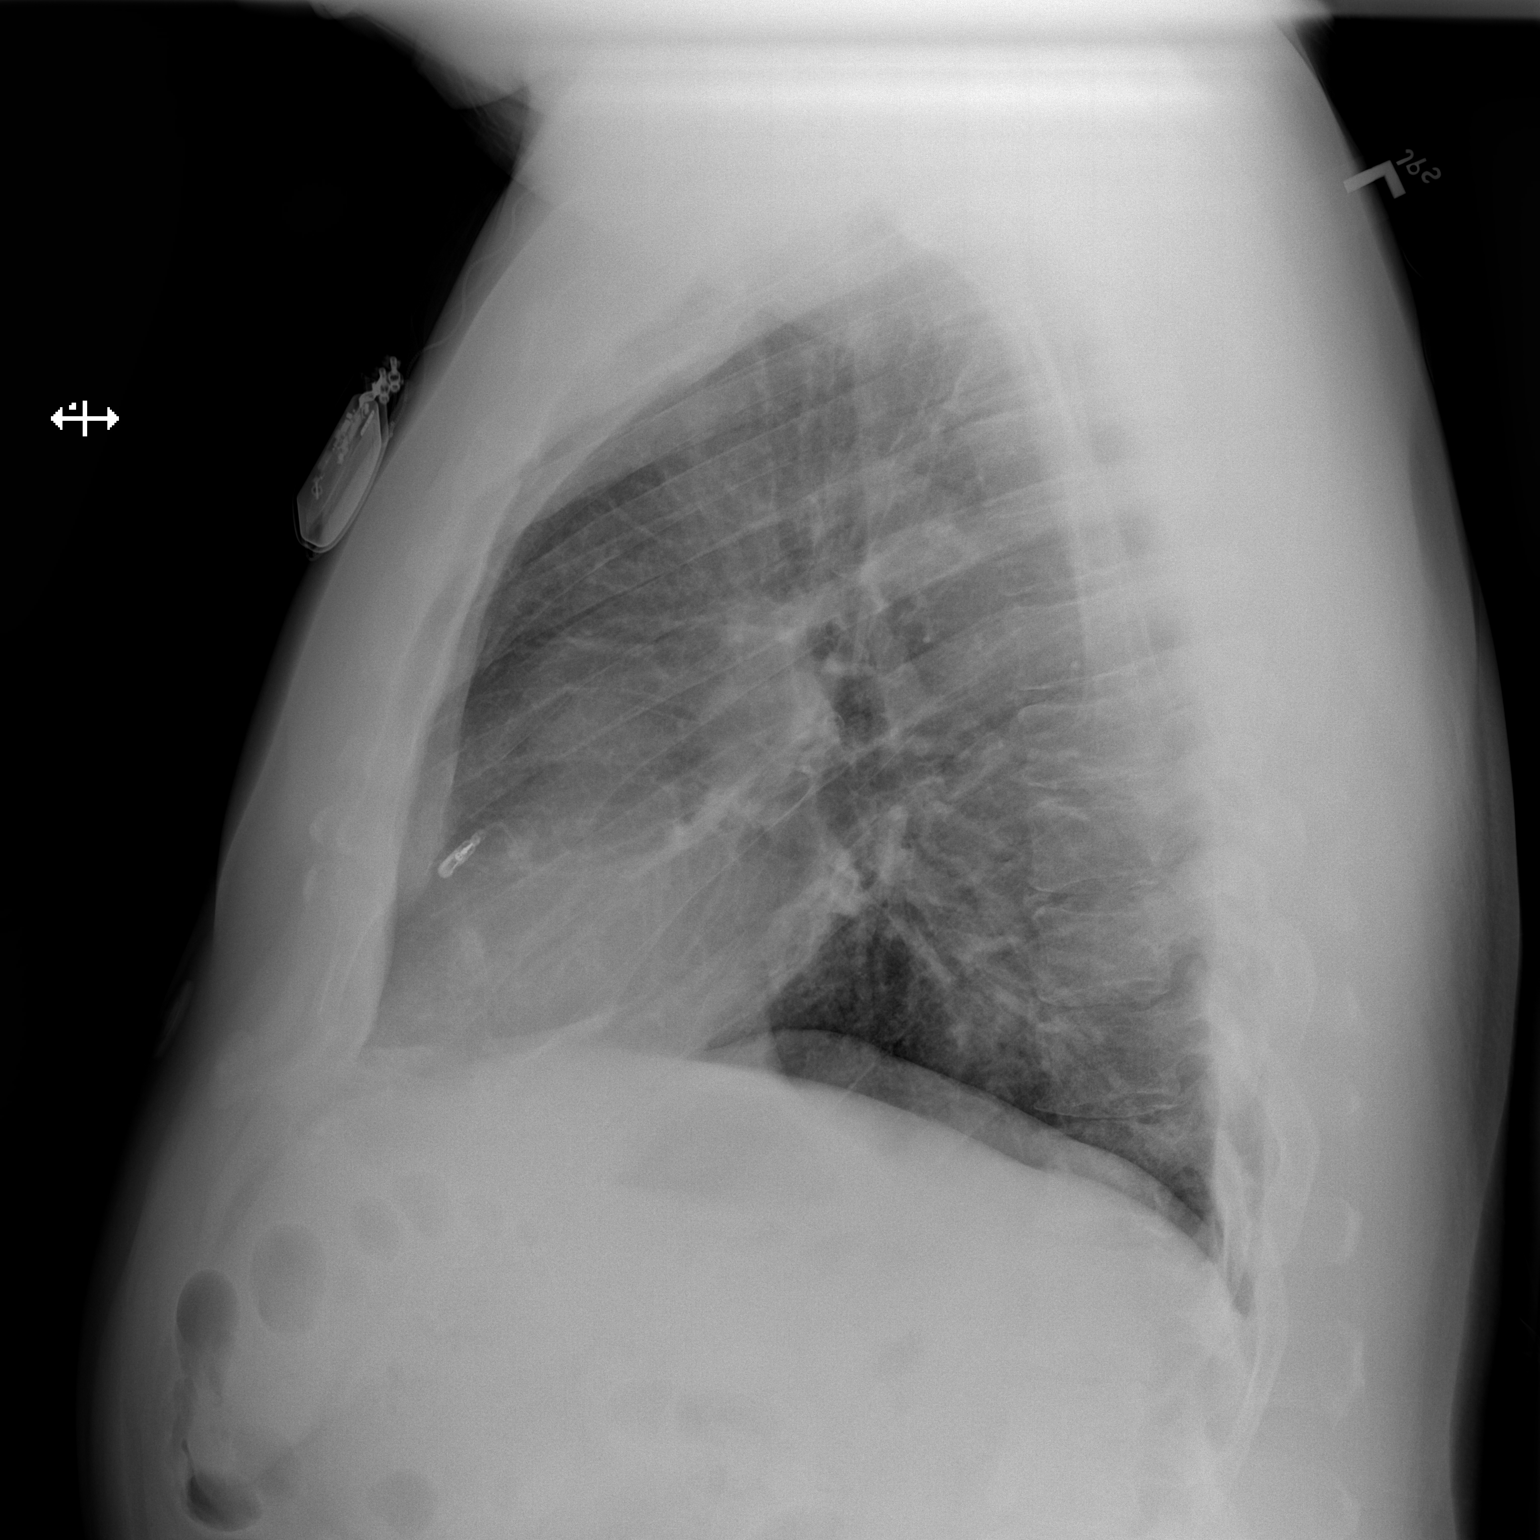

[2 of 2 positions shown; findings below may reference images not displayed]

FINDINGS: The heart size and pulmonary vascularity are normal and the lungs
are clear. No effusions.

Hypoglossal nerve stimulator overlies the right hemithorax.

Bones are normal.
IMPRESSION: No active cardiopulmonary disease.

## 2022-04-26 ENCOUNTER — Emergency Department: Admit: 2022-04-26 | Payer: BLUE CROSS/BLUE SHIELD | Primary: Family Medicine

## 2022-04-26 ENCOUNTER — Inpatient Hospital Stay
Admit: 2022-04-26 | Discharge: 2022-04-26 | Disposition: A | Discharge status: Home / Self Care | Payer: BLUE CROSS/BLUE SHIELD | Attending: Emergency Medicine

## 2022-04-26 DIAGNOSIS — K5900 Constipation, unspecified: Secondary | ICD-10-CM

## 2022-04-26 LAB — CBC WITH AUTO DIFFERENTIAL
Absolute Immature Granulocyte: 0 10*3/uL (ref 0.00–0.04)
Basophils %: 1 % (ref 0–1)
Basophils Absolute: 0 10*3/uL (ref 0.0–0.1)
Eosinophils %: 2 % (ref 0–7)
Eosinophils Absolute: 0.2 10*3/uL (ref 0.0–0.4)
Hematocrit: 45.5 % (ref 36.6–50.3)
Hemoglobin: 16.1 g/dL (ref 12.1–17.0)
Immature Granulocytes: 1 % — ABNORMAL HIGH (ref 0.0–0.5)
Lymphocytes %: 20 % (ref 12–49)
Lymphocytes Absolute: 1.7 10*3/uL (ref 0.8–3.5)
MCH: 30.8 PG (ref 26.0–34.0)
MCHC: 35.4 g/dL (ref 30.0–36.5)
MCV: 87 FL (ref 80.0–99.0)
MPV: 10.1 FL (ref 8.9–12.9)
Monocytes %: 9 % (ref 5–13)
Monocytes Absolute: 0.8 10*3/uL (ref 0.0–1.0)
Neutrophils %: 67 % (ref 32–75)
Neutrophils Absolute: 5.5 10*3/uL (ref 1.8–8.0)
Nucleated RBCs: 0 PER 100 WBC
Platelets: 292 10*3/uL (ref 150–400)
RBC: 5.23 M/uL (ref 4.10–5.70)
RDW: 12.4 % (ref 11.5–14.5)
WBC: 8.2 10*3/uL (ref 4.1–11.1)
nRBC: 0 10*3/uL (ref 0.00–0.01)

## 2022-04-26 LAB — URINALYSIS WITH MICROSCOPIC
BACTERIA, URINE: NEGATIVE /hpf
Bilirubin Urine: NEGATIVE
Blood, Urine: NEGATIVE
Glucose, UA: NEGATIVE mg/dL
Nitrite, Urine: NEGATIVE
Specific Gravity, UA: 1.023 (ref 1.003–1.030)
Urobilinogen, Urine: 0.2 EU/dL (ref 0.2–1.0)
pH, Urine: 5.5 (ref 5.0–8.0)

## 2022-04-26 LAB — COMPREHENSIVE METABOLIC PANEL
ALT: 126 U/L — ABNORMAL HIGH (ref 12–78)
AST: 66 U/L — ABNORMAL HIGH (ref 15–37)
Albumin/Globulin Ratio: 0.9 — ABNORMAL LOW (ref 1.1–2.2)
Albumin: 4.2 g/dL (ref 3.5–5.0)
Alk Phosphatase: 92 U/L (ref 45–117)
Anion Gap: 1 mmol/L — ABNORMAL LOW (ref 5–15)
BUN: 10 MG/DL (ref 6–20)
Bun/Cre Ratio: 9 — ABNORMAL LOW (ref 12–20)
CO2: 30 mmol/L (ref 21–32)
Calcium: 10.3 MG/DL — ABNORMAL HIGH (ref 8.5–10.1)
Chloride: 103 mmol/L (ref 97–108)
Creatinine: 1.14 MG/DL (ref 0.70–1.30)
Est, Glom Filt Rate: >60 mL/min/{1.73_m2} (ref >60)
Globulin: 4.7 g/dL — ABNORMAL HIGH (ref 2.0–4.0)
Glucose: 153 mg/dL — ABNORMAL HIGH (ref 65–100)
Potassium: 4.2 mmol/L (ref 3.5–5.1)
Sodium: 134 mmol/L — ABNORMAL LOW (ref 136–145)
Total Bilirubin: 0.7 MG/DL (ref 0.2–1.0)
Total Protein: 8.9 g/dL — ABNORMAL HIGH (ref 6.4–8.2)

## 2022-04-26 LAB — EKG 12-LEAD
Atrial Rate: 94 {beats}/min
Diagnosis: NORMAL
P Axis: 38 degrees
P-R Interval: 132 ms
Q-T Interval: 330 ms
QRS Duration: 70 ms
QTc Calculation (Bazett): 412 ms
R Axis: -24 degrees
T Axis: -11 degrees
Ventricular Rate: 94 {beats}/min

## 2022-04-26 LAB — LIPASE: Lipase: 220 U/L — ABNORMAL HIGH (ref 13–75)

## 2022-04-26 MED ORDER — KETOROLAC TROMETHAMINE 30 MG/ML IJ SOLN
30 | INTRAMUSCULAR | Status: AC
Start: 2022-04-26 — End: 2022-04-26
  Administered 2022-04-26: 15:00:00 15 mg via INTRAVENOUS

## 2022-04-26 MED ORDER — ONDANSETRON HCL 4 MG/2ML IJ SOLN
4 | INTRAMUSCULAR | Status: AC
Start: 2022-04-26 — End: 2022-04-26
  Administered 2022-04-26: 15:00:00 4 mg via INTRAVENOUS

## 2022-04-26 MED ORDER — SODIUM CHLORIDE 0.9 % IV BOLUS
0.9 | Freq: Once | INTRAVENOUS | Status: AC
Start: 2022-04-26 — End: 2022-04-26
  Administered 2022-04-26: 15:00:00 1000 mL via INTRAVENOUS

## 2022-04-26 MED ORDER — IOPAMIDOL 76 % IV SOLN
76 | Freq: Once | INTRAVENOUS | Status: AC | PRN
Start: 2022-04-26 — End: 2022-04-26
  Administered 2022-04-26: 15:00:00 100 mL via INTRAVENOUS

## 2022-04-26 MED ORDER — POLYETHYLENE GLYCOL 3350 17 GM/SCOOP PO POWD
17 | Freq: Every day | ORAL | 1 refills | Status: AC
Start: 2022-04-26 — End: 2022-05-26

## 2022-04-26 MED ORDER — DOCUSATE SODIUM 100 MG PO CAPS
100 | ORAL_CAPSULE | Freq: Two times a day (BID) | ORAL | 0 refills | Status: AC
Start: 2022-04-26 — End: 2022-05-26

## 2022-04-26 MED FILL — KETOROLAC TROMETHAMINE 30 MG/ML IJ SOLN: 30 MG/ML | INTRAMUSCULAR | Qty: 1

## 2022-04-26 MED FILL — ISOVUE-370 76 % IV SOLN: 76 % | INTRAVENOUS | Qty: 100

## 2022-04-26 MED FILL — ONDANSETRON HCL 4 MG/2ML IJ SOLN: 4 MG/2ML | INTRAMUSCULAR | Qty: 2

## 2022-04-26 MED FILL — SODIUM CHLORIDE 0.9 % IV SOLN: 0.9 % | INTRAVENOUS | Qty: 1000

## 2022-04-26 NOTE — Discharge Instructions (Addendum)
Thank you for allowing [place] to provide you with medical care today.  We realize that you have many choices for your emergency care needs.  We thank you for choosing [facility].  Please choose [place] in the future for any continued health care needs.     We hope we addressed all of your medical concerns. We strive to provide excellent quality care in the Emergency Department.  Anything less than excellent does not meet our expectations.     The exam and treatment you received in the Emergency Department were for an emergent problem and are not intended as complete care. It is important that you follow up with a doctor, nurse practitioner, or physician's assistant for ongoing care. If your symptoms worsen or you do not improve as expected and you are unable to reach your usual health care provider, you should return to the Emergency Department. We are available 24 hours a day.     Take this sheet with you when you go to your follow-up visit.     If you have any problem arranging the follow-up visit, contact the Emergency Department immediately.     Make an appointment your family doctor for follow up of this visit. Return to the ER if you are unable to be seen in a timely manner.     Below is a summary of your results.    Labs  Recent Results (from the past 12 hour(s))   CBC with Auto Differential    Collection Time: 04/26/22  9:05 AM   Result Value Ref Range    WBC 8.2 4.1 - 11.1 K/uL    RBC 5.23 4.10 - 5.70 M/uL    Hemoglobin 16.1 12.1 - 17.0 g/dL    Hematocrit 45.5 36.6 - 50.3 %    MCV 87.0 80.0 - 99.0 FL    MCH 30.8 26.0 - 34.0 PG    MCHC 35.4 30.0 - 36.5 g/dL    RDW 12.4 11.5 - 14.5 %    Platelets 292 150 - 400 K/uL    MPV 10.1 8.9 - 12.9 FL    Nucleated RBCs 0.0 0 PER 100 WBC    nRBC 0.00 0.00 - 0.01 K/uL    Neutrophils % 67 32 - 75 %    Lymphocytes % 20 12 - 49 %    Monocytes % 9 5 - 13 %    Eosinophils % 2 0 - 7 %    Basophils % 1 0 - 1 %    Immature Granulocytes 1 (H) 0.0 - 0.5 %     Neutrophils Absolute 5.5 1.8 - 8.0 K/UL    Lymphocytes Absolute 1.7 0.8 - 3.5 K/UL    Monocytes Absolute 0.8 0.0 - 1.0 K/UL    Eosinophils Absolute 0.2 0.0 - 0.4 K/UL    Basophils Absolute 0.0 0.0 - 0.1 K/UL    Absolute Immature Granulocyte 0.0 0.00 - 0.04 K/UL    Differential Type AUTOMATED     CMP    Collection Time: 04/26/22  9:05 AM   Result Value Ref Range    Sodium 134 (L) 136 - 145 mmol/L    Potassium 4.2 3.5 - 5.1 mmol/L    Chloride 103 97 - 108 mmol/L    CO2 30 21 - 32 mmol/L    Anion Gap 1 (L) 5 - 15 mmol/L    Glucose 153 (H) 65 - 100 mg/dL    BUN 10 6 - 20 MG/DL    Creatinine 1.14  0.70 - 1.30 MG/DL    Bun/Cre Ratio 9 (L) 12 - 20      Est, Glom Filt Rate >60 >60 ml/min/1.4m    Calcium 10.3 (H) 8.5 - 10.1 MG/DL    Total Bilirubin 0.7 0.2 - 1.0 MG/DL    ALT 126 (H) 12 - 78 U/L    AST 66 (H) 15 - 37 U/L    Alk Phosphatase 92 45 - 117 U/L    Total Protein 8.9 (H) 6.4 - 8.2 g/dL    Albumin 4.2 3.5 - 5.0 g/dL    Globulin 4.7 (H) 2.0 - 4.0 g/dL    Albumin/Globulin Ratio 0.9 (L) 1.1 - 2.2     Lipase    Collection Time: 04/26/22  9:05 AM   Result Value Ref Range    Lipase 220 (H) 13 - 75 U/L   Urinalysis with Microscopic    Collection Time: 04/26/22  9:22 AM   Result Value Ref Range    Color, UA YELLOW/STRAW      Appearance CLEAR CLEAR      Specific Gravity, UA 1.023 1.003 - 1.030      pH, Urine 5.5 5.0 - 8.0      Protein, UA TRACE (A) NEG mg/dL    Glucose, UA Negative NEG mg/dL    Ketones, Urine TRACE (A) NEG mg/dL    Bilirubin Urine Negative NEG      Blood, Urine Negative NEG      Urobilinogen, Urine 0.2 0.2 - 1.0 EU/dL    Nitrite, Urine Negative NEG      Leukocyte Esterase, Urine TRACE (A) NEG      WBC, UA 0-4 0 - 4 /hpf    RBC, UA 0-5 0 - 5 /hpf    Epithelial Cells UA FEW FEW /lpf    BACTERIA, URINE Negative NEG /hpf   EKG 12 Lead    Collection Time: 04/26/22  9:31 AM   Result Value Ref Range    Ventricular Rate 94 BPM    Atrial Rate 94 BPM    P-R Interval 132 ms    QRS Duration 70 ms    Q-T Interval 330 ms     QTc Calculation (Bazett) 412 ms    P Axis 38 degrees    R Axis -24 degrees    T Axis -11 degrees    Diagnosis       Normal sinus rhythm  Poor R-wave Progression (consider lead placement or loss of anterior forces)  No previous ECGs available  Confirmed by CCleophas [phone] on 04/26/2022 11:51:28 AM         Radiologic Studies  [place] Specify organ? GALLBLADDER   Final Result   1. Increased echogenicity of liver, suggesting a diffuse hepatocellular process   most commonly seen in steatosis.   2. Right renal cortical cyst.   3. Otherwise unremarkable right upper quadrant ultrasound.   .      CT ABDOMEN PELVIS W IV CONTRAST Additional Contrast? Radiologist Recommendation   Final Result   No evidence of acute process.      Call your gastroenterologist for follow-up in the next 24 to 48 hours.  Consider repeat kidney labs, liver labs, and lipase.  Return to the ED with any new or worsening symptoms.

## 2022-04-26 NOTE — ED Provider Notes (Incomplete)
[facility] EMERGENCY DEP  EMERGENCY DEPARTMENT ENCOUNTER      Date: 04/26/2022  Patient Name: Dean Sosa  MRN: [id number]  Birthdate 04/25/1970  Date of evaluation: 04/26/2022  Provider: Ruthe Mannan, APRN - NP   Note Started: 8:55 AM EST 04/26/22    CHIEF COMPLAINT     Chief Complaint   Patient presents with   . Constipation       HISTORY OF PRESENT ILLNESS  (Onset, Location, Duration, Character, Alleviating/Aggravating, Radiation, Timing, Severity)   Note limiting factors.   History Provided By: Patient     HPI: Dean Sosa is a 53 y.o. male with a history of PTSD and elevated A1C presents with lower abdominal pain that radiates to his back.  Onset 4 days ago. +nausea. - vomiting.  No surgical abdominal history. Last BM 10 days ago. Has used 2 fleets (2 d ago), 2 ducolax, 2 mag citrate (3 days ago). Followed by liquid stool. Took ibuprofen, last was yesterday.  He denies fever, melena, hematochezia, urinary symptoms, testicular swelling, chest pain, difficulty breathing.    Dilantin allergy: sz  Pork allergy    Nursing Notes and triage vitals were reviewed.  PCP: Elwin Mocha, MD    PAST MEDICAL HISTORY   Past Medical History:  Past Medical History:   Diagnosis Date   . Pre-diabetes    . PTSD (post-traumatic stress disorder)    . Sleep apnea        Past Surgical History:  History reviewed. No pertinent surgical history.    Family History:  History reviewed. No pertinent family history.    Social History:       Allergies:  Allergies   Allergen Reactions   . Dilantin [Phenytoin] Other (See Comments)     Causes seizures    . Pork-Derived Products Other (See Comments)       Current Meds:   No current facility-administered medications for this encounter.     Current Outpatient Medications   Medication Sig Dispense Refill   . Continuous Blood Gluc Receiver (FREESTYLE LIBRE 14 DAY READER) DEVI as directed     . metFORMIN (GLUCOPHAGE) 500 MG tablet 2 tablets with a meal Orally twice a day for 90 days     .  cetirizine (ZYRTEC) 10 MG tablet Take 1 tablet by mouth     . fexofenadine (ALLEGRA) 180 MG tablet Take 1 tablet by mouth daily     . fluticasone (FLONASE) 50 MCG/ACT nasal spray 1 spray by Nasal route as needed     . lisinopril (PRINIVIL;ZESTRIL) 5 MG tablet Take 1 tablet by mouth Every Day         Social Determinants of Health:   Social Determinants of Health     Tobacco Use: Not on file   Alcohol Use: Not on file   Financial Resource Strain: Not on file   Food Insecurity: Not on file   Transportation Needs: Not on file   Physical Activity: Not on file   Stress: Not on file   Social Connections: Not on file   Intimate Partner Violence: Not on file   Depression: Not on file   Housing Stability: Not on file   Interpersonal Safety: Not on file   Utilities: Not on file         PHYSICAL EXAM  (up to 7 for level 4, 8 or more for level 5)     There is no height or weight on file to calculate BMI.    Physical Exam  Constitutional:       General: He is not in acute distress.     Appearance: Normal appearance. He is not ill-appearing.   HENT:      Head: Normocephalic.      Nose: Nose normal. No congestion or rhinorrhea.      Mouth/Throat:      Mouth: Mucous membranes are moist.      Pharynx: Oropharynx is clear.   Eyes:      Extraocular Movements: Extraocular movements intact.      Conjunctiva/sclera: Conjunctivae normal.   Cardiovascular:      Rate and Rhythm: Normal rate and regular rhythm.      Pulses: Normal pulses.      Heart sounds: Normal heart sounds. No murmur heard.  Pulmonary:      Effort: Pulmonary effort is normal. No respiratory distress.      Breath sounds: Normal breath sounds.   Abdominal:      General: Bowel sounds are normal. There is no distension.      Palpations: Abdomen is soft. There is no mass.      Tenderness: There is no abdominal tenderness. There is no right CVA tenderness, left CVA tenderness or guarding. Negative signs include Murphy's sign and McBurney's sign.      Hernia: No hernia is  present.      Comments: Diffuse abdominal tenderness without guarding or peritoneal signs.  Abdomen soft without palpable hernia.   Musculoskeletal:         General: Normal range of motion.      Cervical back: Normal range of motion and neck supple.      Right lower leg: No edema.      Left lower leg: No edema.   Skin:     General: Skin is warm and dry.   Neurological:      General: No focal deficit present.      Mental Status: He is alert and oriented to person, place, and time.   Psychiatric:         Mood and Affect: Mood normal.         Behavior: Behavior normal.     DIAGNOSTIC RESULTS     EKG: All EKG's are interpreted by the Emergency Department Physician who either signs or Co-signs this chart in the absence of a cardiologist.  EKG:.Not Applicable      RADIOLOGIC STUDIES:   Non-plain film images such as CT, Ultrasound and MRI are read by the radiologist. Plain radiographic images are visualized and preliminarily interpreted by the ED Provider with the following findings: See ED Course Below    Interpretation per the Radiologist below, if available at the time of this note:  US ABDOMEN LIMITED Specify organ? GALLBLADDER   Final Result   1. Increased echogenicity of liver, suggesting a diffuse hepatocellular process   most commonly seen in steatosis.   2. Right renal cortical cyst.   3. Otherwise unremarkable right upper quadrant ultrasound.   .      CT ABDOMEN PELVIS W IV CONTRAST Additional Contrast? Radiologist Recommendation   Final Result   No evidence of acute process.           LABS:  Labs Reviewed   CBC WITH AUTO DIFFERENTIAL - Abnormal; Notable for the following components:       Result Value    Immature Granulocytes 1 (*)     All other components within normal limits   COMPREHENSIVE METABOLIC PANEL - Abnormal; Notable for the following components:  Sodium 134 (*)     Anion Gap 1 (*)     Glucose 153 (*)     Bun/Cre Ratio 9 (*)     Calcium 10.3 (*)     ALT 126 (*)     AST 66 (*)     Total Protein 8.9  (*)     Globulin 4.7 (*)     Albumin/Globulin Ratio 0.9 (*)     All other components within normal limits   LIPASE - Abnormal; Notable for the following components:    Lipase 220 (*)     All other components within normal limits   URINALYSIS WITH MICROSCOPIC - Abnormal; Notable for the following components:    Protein, UA TRACE (*)     Ketones, Urine TRACE (*)     Leukocyte Esterase, Urine TRACE (*)     All other components within normal limits     Recent Results (from the past 12 hour(s))   CBC with Auto Differential    Collection Time: 04/26/22  9:05 AM   Result Value Ref Range    WBC 8.2 4.1 - 11.1 K/uL    RBC 5.23 4.10 - 5.70 M/uL    Hemoglobin 16.1 12.1 - 17.0 g/dL    Hematocrit 16.1 09.6 - 50.3 %    MCV 87.0 80.0 - 99.0 FL    MCH 30.8 26.0 - 34.0 PG    MCHC 35.4 30.0 - 36.5 g/dL    RDW 04.5 40.9 - 81.1 %    Platelets 292 150 - 400 K/uL    MPV 10.1 8.9 - 12.9 FL    Nucleated RBCs 0.0 0 PER 100 WBC    nRBC 0.00 0.00 - 0.01 K/uL    Neutrophils % 67 32 - 75 %    Lymphocytes % 20 12 - 49 %    Monocytes % 9 5 - 13 %    Eosinophils % 2 0 - 7 %    Basophils % 1 0 - 1 %    Immature Granulocytes 1 (H) 0.0 - 0.5 %    Neutrophils Absolute 5.5 1.8 - 8.0 K/UL    Lymphocytes Absolute 1.7 0.8 - 3.5 K/UL    Monocytes Absolute 0.8 0.0 - 1.0 K/UL    Eosinophils Absolute 0.2 0.0 - 0.4 K/UL    Basophils Absolute 0.0 0.0 - 0.1 K/UL    Absolute Immature Granulocyte 0.0 0.00 - 0.04 K/UL    Differential Type AUTOMATED     CMP    Collection Time: 04/26/22  9:05 AM   Result Value Ref Range    Sodium 134 (L) 136 - 145 mmol/L    Potassium 4.2 3.5 - 5.1 mmol/L    Chloride 103 97 - 108 mmol/L    CO2 30 21 - 32 mmol/L    Anion Gap 1 (L) 5 - 15 mmol/L    Glucose 153 (H) 65 - 100 mg/dL    BUN 10 6 - 20 MG/DL    Creatinine 9.14 7.82 - 1.30 MG/DL    Bun/Cre Ratio 9 (L) 12 - 20      Est, Glom Filt Rate >60 >60 ml/min/1.49m2    Calcium 10.3 (H) 8.5 - 10.1 MG/DL    Total Bilirubin 0.7 0.2 - 1.0 MG/DL    ALT 956 (H) 12 - 78 U/L    AST 66 (H) 15 - 37  U/L    Alk Phosphatase 92 45 - 117 U/L    Total Protein 8.9 (H) 6.4 -  8.2 g/dL    Albumin 4.2 3.5 - 5.0 g/dL    Globulin 4.7 (H) 2.0 - 4.0 g/dL    Albumin/Globulin Ratio 0.9 (L) 1.1 - 2.2     Lipase    Collection Time: 04/26/22  9:05 AM   Result Value Ref Range    Lipase 220 (H) 13 - 75 U/L   Urinalysis with Microscopic    Collection Time: 04/26/22  9:22 AM   Result Value Ref Range    Color, UA YELLOW/STRAW      Appearance CLEAR CLEAR      Specific Gravity, UA 1.023 1.003 - 1.030      pH, Urine 5.5 5.0 - 8.0      Protein, UA TRACE (A) NEG mg/dL    Glucose, UA Negative NEG mg/dL    Ketones, Urine TRACE (A) NEG mg/dL    Bilirubin Urine Negative NEG      Blood, Urine Negative NEG      Urobilinogen, Urine 0.2 0.2 - 1.0 EU/dL    Nitrite, Urine Negative NEG      Leukocyte Esterase, Urine TRACE (A) NEG      WBC, UA 0-4 0 - 4 /hpf    RBC, UA 0-5 0 - 5 /hpf    Epithelial Cells UA FEW FEW /lpf    BACTERIA, URINE Negative NEG /hpf   EKG 12 Lead    Collection Time: 04/26/22  9:31 AM   Result Value Ref Range    Ventricular Rate 94 BPM    Atrial Rate 94 BPM    P-R Interval 132 ms    QRS Duration 70 ms    Q-T Interval 330 ms    QTc Calculation (Bazett) 412 ms    P Axis 38 degrees    R Axis -24 degrees    T Axis -11 degrees    Diagnosis       Normal sinus rhythm  Poor R-wave Progression (consider lead placement or loss of anterior forces)  No previous ECGs available  Confirmed by Tommy Medal [phone]) on 04/26/2022 11:51:28 AM         All other labs were within normal range or not returned as of this dictation.    EMERGENCY DEPARTMENT COURSE  (Differential Diagnosis / MDM / Reassessment / Consults / Education)   Vitals:    Vitals:    04/26/22 0856 04/26/22 1104 04/26/22 1359   BP: (!) 142/103 (!) 135/91 121/86   Pulse: (!) 104 72 73   Resp: 16 16 16    Temp: 98.3 F (36.8 C)     TempSrc: Oral     SpO2: 96% 100% 96%   Weight: 110.5 kg (243 lb 9.7 oz)         ED COURSE  ED Course as of 04/26/22 1415   Mon Apr 26, 2022   7741  52 year old male presents with diffuse abdominal pain and constipation.  He is hypertensive and tachycardic on triage vitals.  Takes lisinopril for his hypertension and "cortisol levels."  No current guarding or peritoneal signs.  Last BM reported 10 days ago.  Multiple attempts at OTC treatment at home without success.  Differentials include bowel obstruction, mesenteric ischemia, hernia, diverticulitis, ileus, gastroparesis, appendicitis, volvulus, constipation.  Will get labs, CT, urine, rehydrate, treat symptomatically, and reassess.  Monitor vitals.  Consider GI consult as needed. [KW]   0950 EKG shows sinus rhythm at a rate of 94, normal intervals, normal axis, no STEMI [RD]   1034 Lipase(!): 220 [KW]  1034 Protein, UA(!): TRACE [KW]   1034 Ketones, Urine(!): TRACE [KW]   1034 Leukocyte Esterase, Urine(!): TRACE [KW]   1035 Nitrite, Urine: Negative [KW]   1035 Blood, Urine: Negative [KW]   1035 Bilirubin, Urine: Negative [KW]   1035 CALCIUM, SERUM, 829562(!): 10.3 [KW]   1035 Est, Glom Filt Rate: >60 [KW]   1035 Creatinine: 1.14 [KW]   1035 BUN,BUNPL: 10 [KW]   1035 WBC: 8.2 [KW]   1035 Hemoglobin Quant: 16.1 [KW]   1035 Hematocrit: 45.5 [KW]   1035 AST(!): 66 [KW]   1035 ALT(!): 126 [KW]   1035 Alk Phos: 92 [KW]   1124 BP(!): 135/91 [KW]   1124 Pulse: 72 [KW]   1125 CT without acute abnormalities.  Noted bilateral kidney cyst. [KW]   1126 Bacteria, UA: Negative [KW]   1126 No previous labs for comparison. [KW]   1414 [place] result - Increased echogenicity of liver, suggesting a diffuse hepatocellular process  most commonly seen in steatosis.  Right renal cortical cyst.  Otherwise unremarkable right upper quadrant ultrasound. [KW]      ED Course User Index  [KW] Ruthe Mannan, APRN - NP  [RD] Norlene Campbell, MD       History, exam, medical decision making, and plan discussed with ED attending, Dr. Sena Slate.    Sepsis Reassessment: Sepsis reassessment not applicable    CONSULTS:  None    Patient was given the  following medications:  Medications   sodium chloride 0.9 % bolus 1,000 mL (0 mLs IntraVENous Stopped 04/26/22 1105)   ketorolac (TORADOL) injection 15 mg (15 mg IntraVENous Given 04/26/22 0936)   ondansetron (ZOFRAN) injection 4 mg (4 mg IntraVENous Given 04/26/22 0935)   iopamidol (ISOVUE-370) 76 % injection 100 mL (100 mLs IntraVENous Given 04/26/22 1025)       Social Determinants affecting Dx or Tx:  PTSD with service dog present    Smoking Cessation: Not Applicable    Records Reviewed (source and summary of external notes): Prior medical records and Nursing notes.       CLINICAL DECISION TOOLS                   PROCEDURES   Unless otherwise noted above, none  Procedures      CRITICAL CARE TIME   Patient does not meet Critical Care Time, 0 minutes    FINAL IMPRESSION     1. Constipation, unspecified constipation type    2. Generalized abdominal pain    3. Kidney cysts          DISPOSITION / PLAN       DISPOSITION      Discharge Note: The patient is stable for discharge home. The signs, symptoms, diagnosis, and discharge instructions have been discussed, understanding conveyed, and agreed upon. The patient is to follow up as recommended or return to ER should their symptoms worsen.       PATIENT REFERRED TO:  [facility] EMERGENCY DEP  [address]  [place] [address]  [phone]    If symptoms worsen    Elwin Mocha, MD  [address]  [place] [address] [place] [address]  [phone]    Call today      [facility]  [address]  [place] [address]  [phone]  Call today        DISCHARGE MEDICATIONS:  New Prescriptions    No medications on file         (Please note that  parts of this dictation were completed with voice recognition software. Quite often unanticipated grammatical, syntax, homophones, and other interpretive errors are inadvertently transcribed by the computer software. Efforts were made to edit the dictation but occasionally words remain mis-transcribed.)    Ruthe Mannan, APRN  - NP (electronically signed)  Emergency Attending Physician / Physician Assistant / Nurse Practitioner

## 2022-04-26 NOTE — ED Triage Notes (Signed)
Pt c/o constipation x 8 days, has tried laxatives and enemas, pt with lower abd pain and back pain, now having upper abd pain , denies, +n/v, denies abd surgeries
